# Patient Record
Sex: Female | Born: 1940 | Race: White | Hispanic: No | Marital: Single | Smoking: Never smoker
Health system: Southern US, Community
[De-identification: ages and names within clinical notes are randomized; demographics above are authoritative.]

## PROBLEM LIST (undated history)

## (undated) DIAGNOSIS — E7800 Pure hypercholesterolemia, unspecified: Secondary | ICD-10-CM

## (undated) DIAGNOSIS — E039 Hypothyroidism, unspecified: Secondary | ICD-10-CM

## (undated) DIAGNOSIS — K219 Gastro-esophageal reflux disease without esophagitis: Secondary | ICD-10-CM

## (undated) HISTORY — DX: Gastro-esophageal reflux disease without esophagitis: K21.9

## (undated) HISTORY — PX: TEMPOROMANDIBULAR JOINT SURGERY: SHX35

## (undated) HISTORY — PX: NECK SURGERY: SHX720

## (undated) HISTORY — DX: Hypothyroidism, unspecified: E03.9

## (undated) HISTORY — PX: TONSILLECTOMY: SUR1361

## (undated) HISTORY — PX: ABDOMINAL HYSTERECTOMY: SHX81

## (undated) HISTORY — PX: ELBOW SURGERY: SHX618

---

## 2002-02-24 ENCOUNTER — Inpatient Hospital Stay (HOSPITAL_COMMUNITY): Admission: AD | Admit: 2002-02-24 | Discharge: 2002-02-24 | Payer: Self-pay | Admitting: Obstetrics and Gynecology

## 2012-01-29 ENCOUNTER — Emergency Department (HOSPITAL_COMMUNITY): Payer: Medicare Other

## 2012-01-29 ENCOUNTER — Encounter (HOSPITAL_COMMUNITY): Payer: Self-pay | Admitting: *Deleted

## 2012-01-29 ENCOUNTER — Emergency Department (HOSPITAL_COMMUNITY)
Admission: EM | Admit: 2012-01-29 | Discharge: 2012-01-29 | Disposition: A | Discharge status: Home / Self Care | Payer: Medicare Other

## 2012-01-29 ENCOUNTER — Emergency Department (HOSPITAL_COMMUNITY)
Admission: EM | Admit: 2012-01-29 | Discharge: 2012-01-29 | Disposition: A | Discharge status: Home / Self Care | Payer: Medicare Other | Attending: Emergency Medicine | Admitting: Emergency Medicine

## 2012-01-29 DIAGNOSIS — E7800 Pure hypercholesterolemia, unspecified: Secondary | ICD-10-CM | POA: Insufficient documentation

## 2012-01-29 DIAGNOSIS — J3489 Other specified disorders of nose and nasal sinuses: Secondary | ICD-10-CM | POA: Insufficient documentation

## 2012-01-29 DIAGNOSIS — J40 Bronchitis, not specified as acute or chronic: Secondary | ICD-10-CM | POA: Insufficient documentation

## 2012-01-29 HISTORY — DX: Pure hypercholesterolemia, unspecified: E78.00

## 2012-01-29 MED ORDER — ALBUTEROL SULFATE HFA 108 (90 BASE) MCG/ACT IN AERS
INHALATION_SPRAY | RESPIRATORY_TRACT | Status: AC
  Filled 2012-01-29: qty 6.7

## 2012-01-29 MED ORDER — ALBUTEROL SULFATE HFA 108 (90 BASE) MCG/ACT IN AERS: 2.0000 | INHALATION_SPRAY | Freq: Four times a day (QID) | RESPIRATORY_TRACT | Status: DC

## 2012-01-29 MED ORDER — GUAIFENESIN ER 600 MG PO TB12: 600.0000 mg | ORAL_TABLET | Freq: Two times a day (BID) | ORAL | Status: AC

## 2012-01-29 MED ORDER — SALINE SPRAY 0.65 % NA SOLN
1.0000 | Freq: Once | NASAL | Status: AC
  Administered 2012-01-29: 1 via NASAL
  Filled 2012-01-29 (×2): qty 44

## 2012-01-29 NOTE — ED Provider Notes (Signed)
Medical screening examination/treatment/procedure(s) were performed by non-physician practitioner and as supervising physician I was immediately available for consultation/collaboration.  Nicholes Stairs, MD 01/29/12 [phone]

## 2012-01-29 NOTE — ED Notes (Signed)
Pt reports cough/congestion since 2/13. Given medication to help when previously seen in ED, states has taken medication without improvement. States is also homeless, not eating, feels weak.

## 2012-01-29 NOTE — Discharge Instructions (Signed)
Your chest Xray was negative and did not show signs of a pneumonia. This is likely a viral process which means that antibiotics will not be helpful. You have been given an albuterol inhaler here. Please use this every 4 hours for the next 2 days then every 4 hours as needed. Alternate ibuprofen and tylenol every 4 hours as needed for fever. Return to the ER if you are having a high fever not controlled by medicine, worsening condition, or with any other concerns.  Bronchitis Bronchitis is the body's way of reacting to injury and/or infection (inflammation) of the bronchi. Bronchi are the air tubes that extend from the windpipe into the lungs. If the inflammation becomes severe, it may cause shortness of breath. CAUSES  Inflammation may be caused by:  A virus.   Germs (bacteria).   Dust.   Allergens.   Pollutants and many other irritants.  The cells lining the bronchial tree are covered with tiny hairs (cilia). These constantly beat upward, away from the lungs, toward the mouth. This keeps the lungs free of pollutants. When these cells become too irritated and are unable to do their job, mucus begins to develop. This causes the characteristic cough of bronchitis. The cough clears the lungs when the cilia are unable to do their job. Without either of these protective mechanisms, the mucus would settle in the lungs. Then you would develop pneumonia. Smoking is a common cause of bronchitis and can contribute to pneumonia. Stopping this habit is the single most important thing you can do to help yourself. TREATMENT   Your caregiver may prescribe an antibiotic if the cough is caused by bacteria. Also, medicines that open up your airways make it easier to breathe. Your caregiver may also recommend or prescribe an expectorant. It will loosen the mucus to be coughed up. Only take over-the-counter or prescription medicines for pain, discomfort, or fever as directed by your caregiver.   Removing whatever  causes the problem (smoking, for example) is critical to preventing the problem from getting worse.   Cough suppressants may be prescribed for relief of cough symptoms.   Inhaled medicines may be prescribed to help with symptoms now and to help prevent problems from returning.   For those with recurrent (chronic) bronchitis, there may be a need for steroid medicines.  SEEK IMMEDIATE MEDICAL CARE IF:   During treatment, you develop more pus-like mucus (purulent sputum).   You have a fever.   Your baby is older than 3 months with a rectal temperature of 102 F (38.9 C) or higher.   Your baby is 58 months old or younger with a rectal temperature of 100.4 F (38 C) or higher.   You become progressively more ill.   You have increased difficulty breathing, wheezing, or shortness of breath.  It is necessary to seek immediate medical care if you are elderly or sick from any other disease. MAKE SURE YOU:   Understand these instructions.   Will watch your condition.   Will get help right away if you are not doing well or get worse.  Document Released: 11/23/2005 Document Revised: 08/05/2011 Document Reviewed: 10/02/2008 [facility] Patient Information 2012 [place], [place].Antibiotic Nonuse  Your caregiver felt that the infection or problem was not one that would be helped with an antibiotic. Infections may be caused by viruses or bacteria. Only a caregiver can tell which one of these is the likely cause of an illness. A cold is the most common cause of infection in both adults  and children. A cold is a virus. Antibiotic treatment will have no effect on a viral infection. Viruses can lead to many lost days of work caring for sick children and many missed days of school. Children may catch as many as 10 "colds" or "flus" per year during which they can be tearful, cranky, and uncomfortable. The goal of treating a virus is aimed at keeping the ill person comfortable. Antibiotics are medications used  to help the body fight bacterial infections. There are relatively few types of bacteria that cause infections but there are hundreds of viruses. While both viruses and bacteria cause infection they are very different types of germs. A viral infection will typically go away by itself within 7 to 10 days. Bacterial infections may spread or get worse without antibiotic treatment. Examples of bacterial infections are:  Sore throats (like strep throat or tonsillitis).   Infection in the lung (pneumonia).   Ear and skin infections.  Examples of viral infections are:  Colds or flus.   Most coughs and bronchitis.   Sore throats not caused by Strep.   Runny noses.  It is often best not to take an antibiotic when a viral infection is the cause of the problem. Antibiotics can kill off the helpful bacteria that we have inside our body and allow harmful bacteria to start growing. Antibiotics can cause side effects such as allergies, nausea, and diarrhea without helping to improve the symptoms of the viral infection. Additionally, repeated uses of antibiotics can cause bacteria inside of our body to become resistant. That resistance can be passed onto harmful bacterial. The next time you have an infection it may be harder to treat if antibiotics are used when they are not needed. Not treating with antibiotics allows our own immune system to develop and take care of infections more efficiently. Also, antibiotics will work better for [place] when they are prescribed for bacterial infections. Treatments for a child that is ill may include:  Give extra fluids throughout the day to stay hydrated.   Get plenty of rest.   Only give your child over-the-counter or prescription medicines for pain, discomfort, or fever as directed by your caregiver.   The use of a cool mist humidifier may help stuffy noses.   Cold medications if suggested by your caregiver.  Your caregiver may decide to start you on an antibiotic  if:  The problem you were seen for today continues for a longer length of time than expected.   You develop a secondary bacterial infection.  SEEK MEDICAL CARE IF:  Fever lasts longer than 5 days.   Symptoms continue to get worse after 5 to 7 days or become severe.   Difficulty in breathing develops.   Signs of dehydration develop (poor drinking, rare urinating, dark colored urine).   Changes in behavior or worsening tiredness (listlessness or lethargy).  Document Released: 02/01/2002 Document Revised: 08/05/2011 Document Reviewed: 07/31/2009 [facility] Patient Information 2012 [place], [place].

## 2012-01-29 NOTE — ED Provider Notes (Signed)
History     CSN: [id number]  Arrival date & time 01/29/12  0907   First MD Initiated Contact with Patient 01/29/12 0935      Chief Complaint  Patient presents with  . Cough  . Nasal Congestion    (Consider location/radiation/quality/duration/timing/severity/associated sxs/prior treatment) Patient is a 71 y.o. female presenting with cough. The history is provided by the patient.  Cough   Patient presents with cough and congestion, which started on February 13. She states that she was seen in the ED at [facility] on February 16. A chest x-ray was performed during that visit which apparently showed a pneumonia. She was placed on a Z-Pak. She states that she has not taken all of this, but does not feel any better. She has had some occasional chills, but denies fever. The cough is productive in nature of yellow sputum. She has not noticed any hemoptysis. Denies chest pain. She is not a smoker. Denies abdominal pain, nausea, vomiting, diarrhea. She states that she is homeless, and states that she "feels weak" due to her living situation and not eating regularly.  Past Medical History  Diagnosis Date  . Hypercholesteremia     Past Surgical History  Procedure Date  . Tonsillectomy   . Abdominal hysterectomy   . Elbow surgery   . Temporomandibular joint surgery   . Neck surgery     No family history on file.  History  Substance Use Topics  . Smoking status: Never Smoker   . Smokeless tobacco: Not on file  . Alcohol Use: No    OB History    Grav Para Term Preterm Abortions TAB SAB Ect Mult Living                  Review of Systems  Respiratory: Positive for cough.    review of systems negative except as indicated in the history of present illness.  Allergies  Stadol  Home Medications   Current Outpatient Rx  Name Route Sig Dispense Refill  . ACETAMINOPHEN 500 MG PO TABS Oral Take 1,000 mg by mouth every 6 (six) hours as needed. pain    . ALENDRONATE SODIUM  70 MG PO TABS Oral Take 70 mg by mouth every 7 (seven) days. Take with a full glass of water on an empty stomach.takes on Tuesday or wednesday    . ASPIRIN EC 81 MG PO TBEC Oral Take 81 mg by mouth daily.    . ESTER C PO Oral Take 1,000 mg by mouth daily.    Marland Kitchen CALCIUM CARBONATE-VITAMIN D 500-200 MG-UNIT PO TABS Oral Take 1 tablet by mouth daily.    Marland Kitchen GABAPENTIN 600 MG PO TABS Oral Take 600 mg by mouth 3 (three) times daily.    Marland Kitchen LEVOTHYROXINE SODIUM 50 MCG PO TABS Oral Take 50 mcg by mouth daily.    Marland Kitchen OMEPRAZOLE 40 MG PO CPDR Oral Take 40 mg by mouth 2 (two) times daily.      BP 138/64  Pulse 79  Temp(Src) 98.2 F (36.8 C) (Oral)  Resp 18  SpO2 97%  Physical Exam  Nursing note and vitals reviewed. Constitutional: She is oriented to person, place, and time. She appears well-developed and well-nourished. No distress.       Slender-appearing, appears older than her stated age  HENT:  Head: Normocephalic and atraumatic.  Right Ear: External ear normal.  Left Ear: External ear normal.  Nose: Nose normal.  Mouth/Throat: Oropharynx is clear and moist. No oropharyngeal exudate.  Bilateral tympanic membranes normal. There is no tenderness to palpation or percussion over the sinuses.  Eyes: Conjunctivae and EOM are normal. Pupils are equal, round, and reactive to light. Right eye exhibits no discharge. Left eye exhibits no discharge.  Neck: Normal range of motion. Neck supple.  Cardiovascular: Normal rate, regular rhythm and normal heart sounds.   Pulmonary/Chest: Effort normal.       Rhonchi to right lung. Left lung sounds normal. Good bilateral air movement.  Abdominal: Soft. Bowel sounds are normal. There is no tenderness. There is no rebound.  Musculoskeletal: Normal range of motion.  Lymphadenopathy:    She has no cervical adenopathy.  Neurological: She is alert and oriented to person, place, and time.  Skin: Skin is warm and dry. No rash noted. She is not diaphoretic.    Psychiatric: She has a normal mood and affect.    ED Course  Procedures (including critical care time)  Labs Reviewed - No data to display Dg Chest 2 View  01/29/2012  *RADIOLOGY REPORT*  Clinical Data: Productive cough for 2 weeks  CHEST - 2 VIEW  Comparison: None.  Findings: The lungs are hyperaerated but no focal infiltrate or effusion is seen.  Mediastinal contours appear normal.  There may be mild scarring in the right lung apex laterally.  The heart is within normal limits in size.  Moderate thoracolumbar scoliosis is present with degenerative change.  IMPRESSION:  Hyperaeration.  No active lung disease.  Original Report Authenticated By: Juline Patch, M.D.   I personally reviewed the CXR.   1. Bronchitis       MDM  Patient was approximately 10 days of cough which has been productive and nasal congestion. She has completed a Z-Pak for this. On exam, she has right-sided rhonchi; exam is otherwise unremarkable. I personally reviewed the chest x-ray; there is no evidence of a pneumonia. She is afebrile here and does not appear toxic. This appears to be a viral process, and she does not need further antibiotics at this time. She was given an albuterol inhaler and saline nasal spray. Respiratory showed her how to use the inhaler. Return precautions were discussed.  Per the patient's request, I contacted the social worker for any resources that she was able to offer the patient's living situation. The patient is currently staying at the [facility], and the Child psychotherapist stated that she did not have any further resources to offer at this time.        Grant Fontana, [place] 01/29/12 [phone]

## 2017-01-26 ENCOUNTER — Encounter: Payer: Self-pay | Admitting: Pediatrics

## 2017-01-26 ENCOUNTER — Ambulatory Visit (INDEPENDENT_AMBULATORY_CARE_PROVIDER_SITE_OTHER): Payer: Medicare HMO | Admitting: Pediatrics

## 2017-01-26 VITALS — BP 150/70 | HR 72 | Temp 97.7°F | Resp 20 | Ht 62.99 in | Wt 99.9 lb

## 2017-01-26 DIAGNOSIS — H1045 Other chronic allergic conjunctivitis: Secondary | ICD-10-CM

## 2017-01-26 DIAGNOSIS — J3089 Other allergic rhinitis: Secondary | ICD-10-CM | POA: Diagnosis not present

## 2017-01-26 DIAGNOSIS — M818 Other osteoporosis without current pathological fracture: Secondary | ICD-10-CM

## 2017-01-26 DIAGNOSIS — H1010 Acute atopic conjunctivitis, unspecified eye: Secondary | ICD-10-CM

## 2017-01-26 DIAGNOSIS — K219 Gastro-esophageal reflux disease without esophagitis: Secondary | ICD-10-CM | POA: Diagnosis not present

## 2017-01-26 MED ORDER — FLUTICASONE PROPIONATE 50 MCG/ACT NA SUSP: NASAL | 5 refills | Status: AC

## 2017-01-26 NOTE — Progress Notes (Signed)
[address] [place] [address] Dept: [phone]  New Patient Note  Patient ID: Brenda Simpson, female    DOB: 02-07-1941  Age: 76 y.o. MRN: [id number] Date of Office Visit: 01/26/2017 Referring provider: Ardyth Gal, MD [address] [place], [place] [address]    Chief Complaint: Allergies (history of allergy injections) and Nasal Congestion (alot of sinus drainage, clears her throat.)  HPI Brenda Simpson presents for evaluation of persistent nasal congestion and postnasal drainage. She has had this problems for several years. She used to be on allergy injections. She has aggravation of her symptoms on exposure to dust, cigarette smoke, perfumes, the springtime, cats and dogs. She has gastroesophageal reflux and occasionally has a cough. She has never had asthmatic symptoms. She has never had eczema  Review of Systems  Constitutional: Negative.   HENT:       Perennial nasal congestion since childhood. At times fullness in the right ear. 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 Occasional cough. No wheezing.  Cardiovascular: Negative.   Gastrointestinal:       Gastroesophageal reflux  Genitourinary:       Hysterectomy  Musculoskeletal:       Osteoporosis. Surgery on her left elbow  Skin: Negative.   Neurological:       Surgery done on her neck  Endo/Heme/Allergies:       No diabetes. She has hypothyroidism  Psychiatric/Behavioral: Negative.     Outpatient Encounter Prescriptions as of 01/26/2017  Medication Sig  . acetaminophen (TYLENOL) 500 MG tablet Take 500 mg by mouth.  Marland Kitchen aspirin EC 81 MG tablet Take 81 mg by mouth daily.  Marland Kitchen Bioflavonoid Products (ESTER C PO) Take 1,000 mg by mouth daily.  Marland Kitchen gabapentin (NEURONTIN) 600 MG tablet Take by mouth.  . Garlic 400 MG TABS Take by mouth.  . levothyroxine (SYNTHROID, LEVOTHROID) 50 MCG tablet Take by  mouth.  . niacin (NIASPAN) 500 MG CR tablet Take 500 mg by mouth.  Marland Kitchen omeprazole (PRILOSEC) 40 MG capsule Take by mouth.  . [DISCONTINUED] aspirin EC 81 MG tablet Take 81 mg by mouth.  Marland Kitchen alendronate (FOSAMAX) 70 MG tablet Take 70 mg by mouth every 7 (seven) days. Take with a full glass of water on an empty stomach.takes on Tuesday or wednesday  . calcium-vitamin D (OSCAL WITH D) 500-200 MG-UNIT per tablet Take 1 tablet by mouth daily.  . fluticasone (FLONASE) 50 MCG/ACT nasal spray 2 sprays per nostril at night  . LYCOPENE PO Frequency:Daily   Dosage:1     Instructions:  Note:TAKE 1 TABLET DAILY.  . [DISCONTINUED] acetaminophen (TYLENOL) 500 MG tablet Take 1,000 mg by mouth every 6 (six) hours as needed. pain  . [DISCONTINUED] gabapentin (NEURONTIN) 600 MG tablet Take 600 mg by mouth 3 (three) times daily.  . [DISCONTINUED] levothyroxine (SYNTHROID, LEVOTHROID) 50 MCG tablet Take 50 mcg by mouth daily.  . [DISCONTINUED] omeprazole (PRILOSEC) 40 MG capsule Take 40 mg by mouth 2 (two) times daily.   No facility-administered encounter medications on file as of 01/26/2017.      Drug Allergies:  Allergies  Allergen Reactions  . Butorphanol Tartrate     Other reaction(s): ATAXIA  . Esomeprazole Magnesium Other (See Comments)    Constipation  . Ibuprofen     Other reaction(s): DIZZINESS  . Stadol [Butorphanol Tartrate] Other (  See Comments)    Cant move  . Cetirizine Other (See Comments)    Drowsiness    Family History: Steffany's family history is not on file..Family history is positive for hay fever. Family history is negative for asthma, sinus problems, eczema, hives, food allergies, chronic bronchitis or emphysema.  Social and environmental. There are no pets in the home. She has never smoked cigarettes. She is not exposed to cigarette smoking.  Physical Exam: BP (!) 150/70 (BP Location: Left Arm, Patient Position: Sitting, Cuff Size: Normal)   Pulse 72   Temp 97.7 F (36.5 C) (Oral)    Resp 20   Ht 5' 2.99" (1.6 m)   Wt 99 lb 13.9 oz (45.3 kg)   BMI 17.70 kg/m    Physical Exam  Constitutional: She is oriented to person, place, and time. She appears well-developed and well-nourished.  HENT:  Eyes normal. Ears normal. Nose turbinates with clear nasal discharge. Pharynx normal. The the left maxillary area was abnormal and had a large scar from her TMJ surgery  Neck: Neck supple. No thyromegaly present.  Cardiovascular:  S1 and S2 normal no murmurs  Pulmonary/Chest:  Clear to percussion and auscultation  Abdominal: Soft. There is no tenderness (no hepatosplenomegaly).  Lymphadenopathy:    She has no cervical adenopathy.  Neurological: She is alert and oriented to person, place, and time.  Skin:  Clear  Psychiatric: She has a normal mood and affect. Her behavior is normal. Judgment and thought content normal.  Vitals reviewed.   Diagnostics: Allergy skin tests were positive to dust mites, cat and dog   Assessment  Assessment and Plan: 1. Other allergic rhinitis   2. Seasonal allergic conjunctivitis   3. Gastroesophageal reflux disease without esophagitis   4. Other osteoporosis without current pathological fracture     Meds ordered this encounter  Medications  . fluticasone (FLONASE) 50 MCG/ACT nasal spray    Sig: 2 sprays per nostril at night    Dispense:  16 g    Refill:  5    For stuffy nose.    Patient Instructions  Environmental control of dust mite Claritin  10 mg once a day for runny nose or itchy eyes Fluticasone 2 sprays per nostril at night for stuffy nose Opcon-A one drop 3 times a day if needed for itchy eyes Call me if you're not doing better on this treatment plan Continue on your other medications  We may need to use allergy injections if her symptoms do not improve   Return in about 4 weeks (around 02/23/2017).   Thank you for the opportunity to care for this patient.  Please do not hesitate to contact me with questions.  Tonette BihariJ.  A. Bardelas, M.D.  [facility]  [address] [place], [place] [address] [phone]

## 2017-01-26 NOTE — Patient Instructions (Addendum)
Environmental control of dust mite Claritin  10 mg once a day for runny nose or itchy eyes Fluticasone 2 sprays per nostril at night for stuffy nose Opcon-A one drop 3 times a day if needed for itchy eyes Call me if you're not doing better on this treatment plan Continue on your other medications  We may need to use allergy injections if her symptoms do not improve

## 2017-01-27 DIAGNOSIS — M818 Other osteoporosis without current pathological fracture: Secondary | ICD-10-CM | POA: Insufficient documentation

## 2017-01-27 DIAGNOSIS — K219 Gastro-esophageal reflux disease without esophagitis: Secondary | ICD-10-CM | POA: Insufficient documentation

## 2017-01-27 DIAGNOSIS — J3089 Other allergic rhinitis: Secondary | ICD-10-CM | POA: Insufficient documentation

## 2017-01-27 DIAGNOSIS — H1010 Acute atopic conjunctivitis, unspecified eye: Secondary | ICD-10-CM | POA: Insufficient documentation

## 2017-02-23 ENCOUNTER — Ambulatory Visit: Payer: Medicare Other | Admitting: Pediatrics

## 2020-06-15 ENCOUNTER — Other Ambulatory Visit: Payer: Self-pay

## 2020-06-15 ENCOUNTER — Emergency Department (HOSPITAL_COMMUNITY): Payer: Medicare Other

## 2020-06-15 ENCOUNTER — Encounter (HOSPITAL_COMMUNITY): Payer: Self-pay

## 2020-06-15 ENCOUNTER — Inpatient Hospital Stay (HOSPITAL_COMMUNITY)
Admission: EM | Admit: 2020-06-15 | Discharge: 2020-06-23 | DRG: 644 | Disposition: A | Discharge status: Skilled Nursing Facility | Payer: Medicare Other | Attending: Internal Medicine | Admitting: Internal Medicine

## 2020-06-15 DIAGNOSIS — Z20822 Contact with and (suspected) exposure to covid-19: Secondary | ICD-10-CM | POA: Diagnosis present

## 2020-06-15 DIAGNOSIS — R1311 Dysphagia, oral phase: Secondary | ICD-10-CM | POA: Diagnosis present

## 2020-06-15 DIAGNOSIS — H409 Unspecified glaucoma: Secondary | ICD-10-CM | POA: Diagnosis present

## 2020-06-15 DIAGNOSIS — G629 Polyneuropathy, unspecified: Secondary | ICD-10-CM | POA: Diagnosis present

## 2020-06-15 DIAGNOSIS — E871 Hypo-osmolality and hyponatremia: Secondary | ICD-10-CM | POA: Diagnosis present

## 2020-06-15 DIAGNOSIS — R1013 Epigastric pain: Secondary | ICD-10-CM | POA: Diagnosis present

## 2020-06-15 DIAGNOSIS — E222 Syndrome of inappropriate secretion of antidiuretic hormone: Secondary | ICD-10-CM | POA: Diagnosis not present

## 2020-06-15 DIAGNOSIS — H547 Unspecified visual loss: Secondary | ICD-10-CM | POA: Diagnosis present

## 2020-06-15 DIAGNOSIS — J949 Pleural condition, unspecified: Secondary | ICD-10-CM | POA: Diagnosis present

## 2020-06-15 DIAGNOSIS — R636 Underweight: Secondary | ICD-10-CM | POA: Diagnosis present

## 2020-06-15 DIAGNOSIS — Z79899 Other long term (current) drug therapy: Secondary | ICD-10-CM

## 2020-06-15 DIAGNOSIS — Z881 Allergy status to other antibiotic agents status: Secondary | ICD-10-CM

## 2020-06-15 DIAGNOSIS — M818 Other osteoporosis without current pathological fracture: Secondary | ICD-10-CM | POA: Diagnosis present

## 2020-06-15 DIAGNOSIS — Z681 Body mass index (BMI) 19 or less, adult: Secondary | ICD-10-CM

## 2020-06-15 DIAGNOSIS — R064 Hyperventilation: Secondary | ICD-10-CM | POA: Diagnosis not present

## 2020-06-15 DIAGNOSIS — K219 Gastro-esophageal reflux disease without esophagitis: Secondary | ICD-10-CM | POA: Diagnosis present

## 2020-06-15 DIAGNOSIS — Z7982 Long term (current) use of aspirin: Secondary | ICD-10-CM

## 2020-06-15 DIAGNOSIS — Z7983 Long term (current) use of bisphosphonates: Secondary | ICD-10-CM

## 2020-06-15 DIAGNOSIS — Z888 Allergy status to other drugs, medicaments and biological substances status: Secondary | ICD-10-CM

## 2020-06-15 DIAGNOSIS — R0600 Dyspnea, unspecified: Secondary | ICD-10-CM

## 2020-06-15 DIAGNOSIS — Z90710 Acquired absence of both cervix and uterus: Secondary | ICD-10-CM

## 2020-06-15 DIAGNOSIS — K5900 Constipation, unspecified: Secondary | ICD-10-CM | POA: Diagnosis not present

## 2020-06-15 DIAGNOSIS — R112 Nausea with vomiting, unspecified: Secondary | ICD-10-CM

## 2020-06-15 DIAGNOSIS — E785 Hyperlipidemia, unspecified: Secondary | ICD-10-CM | POA: Diagnosis present

## 2020-06-15 DIAGNOSIS — G253 Myoclonus: Secondary | ICD-10-CM | POA: Diagnosis present

## 2020-06-15 DIAGNOSIS — E039 Hypothyroidism, unspecified: Secondary | ICD-10-CM | POA: Diagnosis present

## 2020-06-15 DIAGNOSIS — J948 Other specified pleural conditions: Secondary | ICD-10-CM | POA: Diagnosis present

## 2020-06-15 DIAGNOSIS — Z886 Allergy status to analgesic agent status: Secondary | ICD-10-CM

## 2020-06-15 DIAGNOSIS — Z79890 Hormone replacement therapy (postmenopausal): Secondary | ICD-10-CM

## 2020-06-15 DIAGNOSIS — F419 Anxiety disorder, unspecified: Secondary | ICD-10-CM | POA: Diagnosis present

## 2020-06-15 DIAGNOSIS — J3089 Other allergic rhinitis: Secondary | ICD-10-CM | POA: Diagnosis present

## 2020-06-15 LAB — CBC WITH DIFFERENTIAL/PLATELET
Abs Immature Granulocytes: 0.03 10*3/uL (ref 0.00–0.07)
Basophils Absolute: 0 10*3/uL (ref 0.0–0.1)
Basophils Relative: 0 %
Eosinophils Absolute: 0 10*3/uL (ref 0.0–0.5)
Eosinophils Relative: 0 %
HCT: 37.5 % (ref 36.0–46.0)
Hemoglobin: 13.7 g/dL (ref 12.0–15.0)
Immature Granulocytes: 0 %
Lymphocytes Relative: 7 %
Lymphs Abs: 0.7 10*3/uL (ref 0.7–4.0)
MCH: 32.4 pg (ref 26.0–34.0)
MCHC: 36.5 g/dL — ABNORMAL HIGH (ref 30.0–36.0)
MCV: 88.7 fL (ref 80.0–100.0)
Monocytes Absolute: 0.7 10*3/uL (ref 0.1–1.0)
Monocytes Relative: 7 %
Neutro Abs: 8.2 10*3/uL — ABNORMAL HIGH (ref 1.7–7.7)
Neutrophils Relative %: 86 %
Platelets: 144 10*3/uL — ABNORMAL LOW (ref 150–400)
RBC: 4.23 MIL/uL (ref 3.87–5.11)
RDW: 11.6 % (ref 11.5–15.5)
WBC: 9.6 10*3/uL (ref 4.0–10.5)
nRBC: 0 % (ref 0.0–0.2)

## 2020-06-15 LAB — COMPREHENSIVE METABOLIC PANEL
ALT: 23 U/L (ref 0–44)
AST: 27 U/L (ref 15–41)
Albumin: 3.8 g/dL (ref 3.5–5.0)
Alkaline Phosphatase: 44 U/L (ref 38–126)
Anion gap: 11 (ref 5–15)
BUN: 15 mg/dL (ref 8–23)
CO2: 23 mmol/L (ref 22–32)
Calcium: 8.6 mg/dL — ABNORMAL LOW (ref 8.9–10.3)
Chloride: 85 mmol/L — ABNORMAL LOW (ref 98–111)
Creatinine, Ser: 0.52 mg/dL (ref 0.44–1.00)
GFR calc Af Amer: >60 mL/min (ref >60)
GFR calc non Af Amer: >60 mL/min (ref >60)
Glucose, Bld: 162 mg/dL — ABNORMAL HIGH (ref 70–99)
Potassium: 4.2 mmol/L (ref 3.5–5.1)
Sodium: 119 mmol/L — CL (ref 135–145)
Total Bilirubin: 1 mg/dL (ref 0.3–1.2)
Total Protein: 6.8 g/dL (ref 6.5–8.1)

## 2020-06-15 LAB — RAPID URINE DRUG SCREEN, HOSP PERFORMED
Amphetamines: NOT DETECTED
Barbiturates: NOT DETECTED
Benzodiazepines: NOT DETECTED
Cocaine: NOT DETECTED
Opiates: NOT DETECTED
Tetrahydrocannabinol: NOT DETECTED

## 2020-06-15 LAB — URINALYSIS, ROUTINE W REFLEX MICROSCOPIC
Bilirubin Urine: NEGATIVE
Glucose, UA: 150 mg/dL — AB
Hgb urine dipstick: NEGATIVE
Ketones, ur: NEGATIVE mg/dL
Leukocytes,Ua: NEGATIVE
Nitrite: NEGATIVE
Protein, ur: NEGATIVE mg/dL
Specific Gravity, Urine: 1.01 (ref 1.005–1.030)
pH: 7 (ref 5.0–8.0)

## 2020-06-15 LAB — D-DIMER, QUANTITATIVE: D-Dimer, Quant: 2.69 ug/mL-FEU — ABNORMAL HIGH (ref 0.00–0.50)

## 2020-06-15 LAB — SODIUM, URINE, RANDOM: Sodium, Ur: 95 mmol/L

## 2020-06-15 LAB — ETHANOL: Alcohol, Ethyl (B): <10 mg/dL (ref <10)

## 2020-06-15 LAB — SARS CORONAVIRUS 2 BY RT PCR (HOSPITAL ORDER, PERFORMED IN ~~LOC~~ HOSPITAL LAB): SARS Coronavirus 2: NEGATIVE

## 2020-06-15 MED ORDER — IOHEXOL 350 MG/ML SOLN
100.0000 mL | Freq: Once | INTRAVENOUS | Status: AC | PRN
  Administered 2020-06-15: 100 mL via INTRAVENOUS

## 2020-06-15 MED ORDER — SODIUM CHLORIDE (PF) 0.9 % IJ SOLN
INTRAMUSCULAR | Status: AC
  Filled 2020-06-15: qty 50

## 2020-06-15 MED ORDER — LORAZEPAM 2 MG/ML IJ SOLN
0.5000 mg | Freq: Once | INTRAMUSCULAR | Status: AC
  Administered 2020-06-15: 0.5 mg via INTRAVENOUS
  Filled 2020-06-15: qty 1

## 2020-06-15 MED ORDER — SODIUM CHLORIDE 1 G PO TABS: 2.0000 g | ORAL_TABLET | Freq: Four times a day (QID) | ORAL | Status: DC

## 2020-06-15 MED ORDER — SODIUM CHLORIDE 0.9 % IV SOLN: INTRAVENOUS | Status: DC

## 2020-06-15 MED ORDER — SODIUM CHLORIDE 1 G PO TABS
1.0000 g | ORAL_TABLET | Freq: Four times a day (QID) | ORAL | Status: DC
  Administered 2020-06-16 (×2): 1 g via ORAL
  Filled 2020-06-15 (×2): qty 1

## 2020-06-15 NOTE — ED Triage Notes (Signed)
Pt BIBA from home-   Per EMS-  Pt c/o SHOB, weakness x 1 week.  Normal PO intake. AOx4.  Ambulatory with assistance.  Denies n/v.  C/o dizziness with movement.   96% on RA; EMS placed on 2 L for comfort.

## 2020-06-15 NOTE — ED Provider Notes (Signed)
[facility]-EMERGENCY DEPT Provider Note   CSN: [id number] Arrival date & time: 06/15/20  1755     History Chief Complaint  Patient presents with  . Shortness of Breath  . Weakness    Brenda Simpson is a 79 y.o. female.  79 year old female presents with nonexertional shortness of breath x2 days.  No reported fever or cough.  She also notes whole body weakness.  Had a long discussion with the patient's son who states that she has had increased anxiety.  The patient is a very poor historian but denies any urinary symptoms.  No chest pain or chest pressure.  No SI or HI.  No treatment use prior to arrival.        Past Medical History:  Diagnosis Date  . GERD (gastroesophageal reflux disease)   . Hypercholesteremia   . Hypothyroidism     Patient Active Problem List   Diagnosis Date Noted  . Other allergic rhinitis 01/27/2017  . Seasonal allergic conjunctivitis 01/27/2017  . Gastroesophageal reflux disease without esophagitis 01/27/2017  . Other osteoporosis without current pathological fracture 01/27/2017    Past Surgical History:  Procedure Laterality Date  . ABDOMINAL HYSTERECTOMY    . ELBOW SURGERY    . NECK SURGERY    . TEMPOROMANDIBULAR JOINT SURGERY    . TONSILLECTOMY       OB History   No obstetric history on file.     Family History  Problem Relation Age of Onset  . Allergic rhinitis Neg Hx   . Angioedema Neg Hx   . Asthma Neg Hx   . Eczema Neg Hx   . Immunodeficiency Neg Hx   . Urticaria Neg Hx     Social History   Tobacco Use  . Smoking status: Never Smoker  . Smokeless tobacco: Never Used  Substance Use Topics  . Alcohol use: No  . Drug use: No    Home Medications Prior to Admission medications   Medication Sig Start Date End Date Taking? Authorizing Provider  acetaminophen (TYLENOL) 500 MG tablet Take 500 mg by mouth. 03/23/12   [provider]  alendronate (FOSAMAX) 70 MG tablet Take 70 mg by mouth  every 7 (seven) days. Take with a full glass of water on an empty stomach.takes on Tuesday or wednesday    [provider]  aspirin EC 81 MG tablet Take 81 mg by mouth daily.    [provider]  Bioflavonoid Products (ESTER C PO) Take 1,000 mg by mouth daily.    [provider]  calcium-vitamin D (OSCAL WITH D) 500-200 MG-UNIT per tablet Take 1 tablet by mouth daily.    [provider]  fluticasone Aleda Grana) 50 MCG/ACT nasal spray 2 sprays per nostril at night 01/26/17   Bardelas, Jose A, MD  gabapentin (NEURONTIN) 600 MG tablet Take by mouth.    [provider]  Garlic 400 MG TABS Take by mouth. 12/13/13   [provider]  levothyroxine (SYNTHROID, LEVOTHROID) 50 MCG tablet Take by mouth.    [provider]  LYCOPENE PO Frequency:Daily   Dosage:1     Instructions:  Note:TAKE 1 TABLET DAILY. 12/15/13   [provider]  niacin (NIASPAN) 500 MG CR tablet Take 500 mg by mouth. 04/18/14   [provider]  omeprazole (PRILOSEC) 40 MG capsule Take by mouth.    [provider]    Allergies    Butorphanol tartrate, Esomeprazole magnesium, Ibuprofen, Stadol [butorphanol tartrate], and Cetirizine  Review of Systems  Review of Systems  Unable to perform ROS: Acuity of condition    Physical Exam Updated Vital Signs BP (!) 159/89 (BP Location: Right Arm)   Pulse 78   Temp 98.7 F (37.1 C) (Oral)   Resp (!) 28   SpO2 99%   Physical Exam Vitals and nursing note reviewed.  Constitutional:      General: She is not in acute distress.    Appearance: Normal appearance. She is well-developed. She is not toxic-appearing.  HENT:     Head: Normocephalic and atraumatic.  Eyes:     General: Lids are normal.     Conjunctiva/sclera: Conjunctivae normal.     Pupils: Pupils are equal, round, and reactive to light.  Neck:     Thyroid: No thyroid mass.     Trachea: No tracheal deviation.  Cardiovascular:     Rate and  Rhythm: Normal rate and regular rhythm.     Heart sounds: Normal heart sounds. No murmur heard.  No gallop.   Pulmonary:     Effort: Pulmonary effort is normal. No respiratory distress.     Breath sounds: Normal breath sounds. No stridor. No decreased breath sounds, wheezing, rhonchi or rales.  Abdominal:     General: Bowel sounds are normal. There is no distension.     Palpations: Abdomen is soft.     Tenderness: There is no abdominal tenderness. There is no rebound.  Musculoskeletal:        General: No tenderness. Normal range of motion.     Cervical back: Normal range of motion and neck supple.  Skin:    General: Skin is warm and dry.     Findings: No abrasion or rash.  Neurological:     General: No focal deficit present.     Mental Status: She is alert and oriented to person, place, and time.     GCS: GCS eye subscore is 4. GCS verbal subscore is 5. GCS motor subscore is 6.     Cranial Nerves: No cranial nerve deficit.     Sensory: No sensory deficit.     Motor: Tremor present.     Comments: Strength is 5 of 5 in all extremities.  Occasional right shoulder twitching noted but does resolve when she is distracted  Psychiatric:        Attention and Perception: Attention normal.        Mood and Affect: Affect is flat.        Speech: Speech is delayed.        Behavior: Behavior is withdrawn.     ED Results / Procedures / Treatments   Labs (all labs ordered are listed, but only abnormal results are displayed) Labs Reviewed  ETHANOL  RAPID URINE DRUG SCREEN, HOSP PERFORMED  CBC WITH DIFFERENTIAL/PLATELET  COMPREHENSIVE METABOLIC PANEL  URINALYSIS, ROUTINE W REFLEX MICROSCOPIC    EKG None  Radiology No results found.  Procedures Procedures (including critical care time)  Medications Ordered in ED Medications  0.9 %  sodium chloride infusion (has no administration in time range)    ED Course  I have reviewed the triage vital signs and the nursing  notes.  Pertinent labs & imaging results that were available during my care of the patient were reviewed by me and considered in my medical decision making (see chart for details).    MDM Rules/Calculators/A&P  Patient with evidence of hyponatremia here.  Given IV saline will be admitted to the hospitalist Final Clinical Impression(s) / ED Diagnoses Final diagnoses:  None    Rx / DC Orders ED Discharge Orders    None       Lorre Nick, MD 06/18/20 1647

## 2020-06-15 NOTE — ED Notes (Signed)
Date and time results received: 06/15/20 8:26 PM  (use smartphrase ".now" to insert current time)  Test: Sodium Critical Value: 119  Name of Provider Notified: Dr.Allen  Orders Received? Or Actions Taken?:

## 2020-06-16 ENCOUNTER — Encounter (HOSPITAL_COMMUNITY): Payer: Self-pay | Admitting: Internal Medicine

## 2020-06-16 DIAGNOSIS — R064 Hyperventilation: Secondary | ICD-10-CM | POA: Diagnosis not present

## 2020-06-16 DIAGNOSIS — E222 Syndrome of inappropriate secretion of antidiuretic hormone: Secondary | ICD-10-CM | POA: Diagnosis present

## 2020-06-16 DIAGNOSIS — Z20822 Contact with and (suspected) exposure to covid-19: Secondary | ICD-10-CM | POA: Diagnosis present

## 2020-06-16 DIAGNOSIS — F419 Anxiety disorder, unspecified: Secondary | ICD-10-CM | POA: Diagnosis present

## 2020-06-16 DIAGNOSIS — G629 Polyneuropathy, unspecified: Secondary | ICD-10-CM | POA: Diagnosis present

## 2020-06-16 DIAGNOSIS — R259 Unspecified abnormal involuntary movements: Secondary | ICD-10-CM | POA: Diagnosis not present

## 2020-06-16 DIAGNOSIS — J3089 Other allergic rhinitis: Secondary | ICD-10-CM | POA: Diagnosis present

## 2020-06-16 DIAGNOSIS — Z7983 Long term (current) use of bisphosphonates: Secondary | ICD-10-CM | POA: Diagnosis not present

## 2020-06-16 DIAGNOSIS — E039 Hypothyroidism, unspecified: Secondary | ICD-10-CM | POA: Diagnosis present

## 2020-06-16 DIAGNOSIS — R636 Underweight: Secondary | ICD-10-CM | POA: Diagnosis present

## 2020-06-16 DIAGNOSIS — E871 Hypo-osmolality and hyponatremia: Secondary | ICD-10-CM

## 2020-06-16 DIAGNOSIS — E785 Hyperlipidemia, unspecified: Secondary | ICD-10-CM | POA: Diagnosis present

## 2020-06-16 DIAGNOSIS — Z79899 Other long term (current) drug therapy: Secondary | ICD-10-CM | POA: Diagnosis not present

## 2020-06-16 DIAGNOSIS — R1311 Dysphagia, oral phase: Secondary | ICD-10-CM | POA: Diagnosis present

## 2020-06-16 DIAGNOSIS — J949 Pleural condition, unspecified: Secondary | ICD-10-CM | POA: Diagnosis present

## 2020-06-16 DIAGNOSIS — G253 Myoclonus: Secondary | ICD-10-CM | POA: Diagnosis present

## 2020-06-16 DIAGNOSIS — K5900 Constipation, unspecified: Secondary | ICD-10-CM | POA: Diagnosis not present

## 2020-06-16 DIAGNOSIS — Z90710 Acquired absence of both cervix and uterus: Secondary | ICD-10-CM | POA: Diagnosis not present

## 2020-06-16 DIAGNOSIS — Z79890 Hormone replacement therapy (postmenopausal): Secondary | ICD-10-CM | POA: Diagnosis not present

## 2020-06-16 DIAGNOSIS — R0600 Dyspnea, unspecified: Secondary | ICD-10-CM | POA: Diagnosis not present

## 2020-06-16 DIAGNOSIS — H547 Unspecified visual loss: Secondary | ICD-10-CM | POA: Diagnosis present

## 2020-06-16 DIAGNOSIS — R1013 Epigastric pain: Secondary | ICD-10-CM | POA: Diagnosis present

## 2020-06-16 DIAGNOSIS — J948 Other specified pleural conditions: Secondary | ICD-10-CM | POA: Diagnosis not present

## 2020-06-16 DIAGNOSIS — Z7982 Long term (current) use of aspirin: Secondary | ICD-10-CM | POA: Diagnosis not present

## 2020-06-16 DIAGNOSIS — K219 Gastro-esophageal reflux disease without esophagitis: Secondary | ICD-10-CM | POA: Diagnosis present

## 2020-06-16 DIAGNOSIS — H409 Unspecified glaucoma: Secondary | ICD-10-CM | POA: Diagnosis present

## 2020-06-16 DIAGNOSIS — Z681 Body mass index (BMI) 19 or less, adult: Secondary | ICD-10-CM | POA: Diagnosis not present

## 2020-06-16 DIAGNOSIS — M818 Other osteoporosis without current pathological fracture: Secondary | ICD-10-CM | POA: Diagnosis present

## 2020-06-16 DIAGNOSIS — R112 Nausea with vomiting, unspecified: Secondary | ICD-10-CM | POA: Diagnosis not present

## 2020-06-16 LAB — BASIC METABOLIC PANEL
Anion gap: 11 (ref 5–15)
Anion gap: 9 (ref 5–15)
Anion gap: 10 (ref 5–15)
Anion gap: 6 (ref 5–15)
Anion gap: 11 (ref 5–15)
BUN: 10 mg/dL (ref 8–23)
BUN: 13 mg/dL (ref 8–23)
BUN: 15 mg/dL (ref 8–23)
BUN: 17 mg/dL (ref 8–23)
BUN: 23 mg/dL (ref 8–23)
CO2: 20 mmol/L — ABNORMAL LOW (ref 22–32)
CO2: 25 mmol/L (ref 22–32)
CO2: 24 mmol/L (ref 22–32)
CO2: 29 mmol/L (ref 22–32)
CO2: 23 mmol/L (ref 22–32)
Calcium: 8.4 mg/dL — ABNORMAL LOW (ref 8.9–10.3)
Calcium: 8.6 mg/dL — ABNORMAL LOW (ref 8.9–10.3)
Calcium: 8.5 mg/dL — ABNORMAL LOW (ref 8.9–10.3)
Calcium: 8.5 mg/dL — ABNORMAL LOW (ref 8.9–10.3)
Calcium: 8.8 mg/dL — ABNORMAL LOW (ref 8.9–10.3)
Chloride: 95 mmol/L — ABNORMAL LOW (ref 98–111)
Chloride: 93 mmol/L — ABNORMAL LOW (ref 98–111)
Chloride: 93 mmol/L — ABNORMAL LOW (ref 98–111)
Chloride: 92 mmol/L — ABNORMAL LOW (ref 98–111)
Chloride: 95 mmol/L — ABNORMAL LOW (ref 98–111)
Creatinine, Ser: 0.69 mg/dL (ref 0.44–1.00)
Creatinine, Ser: 0.57 mg/dL (ref 0.44–1.00)
Creatinine, Ser: 0.63 mg/dL (ref 0.44–1.00)
Creatinine, Ser: 0.73 mg/dL (ref 0.44–1.00)
Creatinine, Ser: 0.86 mg/dL (ref 0.44–1.00)
GFR calc Af Amer: >60 mL/min (ref >60)
GFR calc Af Amer: >60 mL/min (ref >60)
GFR calc Af Amer: >60 mL/min (ref >60)
GFR calc Af Amer: >60 mL/min (ref >60)
GFR calc Af Amer: >60 mL/min (ref >60)
GFR calc non Af Amer: >60 mL/min (ref >60)
GFR calc non Af Amer: >60 mL/min (ref >60)
GFR calc non Af Amer: >60 mL/min (ref >60)
GFR calc non Af Amer: >60 mL/min (ref >60)
GFR calc non Af Amer: >60 mL/min (ref >60)
Glucose, Bld: 124 mg/dL — ABNORMAL HIGH (ref 70–99)
Glucose, Bld: 117 mg/dL — ABNORMAL HIGH (ref 70–99)
Glucose, Bld: 126 mg/dL — ABNORMAL HIGH (ref 70–99)
Glucose, Bld: 109 mg/dL — ABNORMAL HIGH (ref 70–99)
Glucose, Bld: 123 mg/dL — ABNORMAL HIGH (ref 70–99)
Potassium: 4.2 mmol/L (ref 3.5–5.1)
Potassium: 4.1 mmol/L (ref 3.5–5.1)
Potassium: 4.3 mmol/L (ref 3.5–5.1)
Potassium: 4.8 mmol/L (ref 3.5–5.1)
Potassium: 4.4 mmol/L (ref 3.5–5.1)
Sodium: 126 mmol/L — ABNORMAL LOW (ref 135–145)
Sodium: 127 mmol/L — ABNORMAL LOW (ref 135–145)
Sodium: 127 mmol/L — ABNORMAL LOW (ref 135–145)
Sodium: 127 mmol/L — ABNORMAL LOW (ref 135–145)
Sodium: 129 mmol/L — ABNORMAL LOW (ref 135–145)

## 2020-06-16 LAB — CORTISOL-AM, BLOOD: Cortisol - AM: 14.9 ug/dL (ref 6.7–22.6)

## 2020-06-16 LAB — OSMOLALITY: Osmolality: 269 mOsm/kg — ABNORMAL LOW (ref 275–295)

## 2020-06-16 LAB — OSMOLALITY, URINE: Osmolality, Ur: 371 mOsm/kg (ref 300–900)

## 2020-06-16 LAB — TSH: TSH: 2.385 u[IU]/mL (ref 0.350–4.500)

## 2020-06-16 MED ORDER — ENSURE ENLIVE PO LIQD
237.0000 mL | Freq: Two times a day (BID) | ORAL | Status: DC
  Administered 2020-06-17 – 2020-06-23 (×8): 237 mL via ORAL

## 2020-06-16 MED ORDER — ACETAMINOPHEN 650 MG RE SUPP: 650.0000 mg | Freq: Four times a day (QID) | RECTAL | Status: DC | PRN

## 2020-06-16 MED ORDER — GABAPENTIN 300 MG PO CAPS
600.0000 mg | ORAL_CAPSULE | Freq: Two times a day (BID) | ORAL | Status: DC
  Administered 2020-06-16: 600 mg via ORAL
  Filled 2020-06-16 (×2): qty 2

## 2020-06-16 MED ORDER — HYOSCYAMINE SULFATE 0.125 MG/5ML PO ELIX
0.2500 mg | ORAL_SOLUTION | Freq: Four times a day (QID) | ORAL | Status: DC | PRN
  Filled 2020-06-16: qty 10

## 2020-06-16 MED ORDER — ONDANSETRON HCL 4 MG/2ML IJ SOLN
4.0000 mg | Freq: Four times a day (QID) | INTRAMUSCULAR | Status: DC | PRN
  Administered 2020-06-20 – 2020-06-21 (×3): 4 mg via INTRAVENOUS
  Filled 2020-06-16 (×3): qty 2

## 2020-06-16 MED ORDER — HYOSCYAMINE SULFATE 0.125 MG/ML PO SOLN
0.2500 mg | Freq: Four times a day (QID) | ORAL | Status: DC | PRN
  Filled 2020-06-16: qty 2

## 2020-06-16 MED ORDER — ONDANSETRON HCL 4 MG PO TABS: 4.0000 mg | ORAL_TABLET | Freq: Four times a day (QID) | ORAL | Status: DC | PRN

## 2020-06-16 MED ORDER — SODIUM CHLORIDE 1 G PO TABS: 1.0000 g | ORAL_TABLET | Freq: Two times a day (BID) | ORAL | Status: DC

## 2020-06-16 MED ORDER — ENOXAPARIN SODIUM 40 MG/0.4ML ~~LOC~~ SOLN
40.0000 mg | SUBCUTANEOUS | Status: DC
  Administered 2020-06-17 – 2020-06-23 (×7): 40 mg via SUBCUTANEOUS
  Filled 2020-06-16 (×7): qty 0.4

## 2020-06-16 MED ORDER — SODIUM CHLORIDE 1 G PO TABS
1.0000 g | ORAL_TABLET | Freq: Three times a day (TID) | ORAL | Status: DC
  Administered 2020-06-16 – 2020-06-17 (×2): 1 g via ORAL
  Filled 2020-06-16 (×4): qty 1

## 2020-06-16 MED ORDER — ACETAMINOPHEN 325 MG PO TABS
650.0000 mg | ORAL_TABLET | Freq: Four times a day (QID) | ORAL | Status: DC | PRN
  Administered 2020-06-16: 650 mg via ORAL
  Filled 2020-06-16: qty 2

## 2020-06-16 NOTE — ED Notes (Signed)
Patient is resting comfortably. 
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
## 2020-06-16 NOTE — ED Notes (Signed)
Meal tray given 
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
## 2020-06-16 NOTE — Progress Notes (Signed)
Patient got confused about her gabapentin medication. RN gave patient 600 mg of gabapentin as scheduled and explained to patient that she is getting the exact medication that she is getting at home but pharmacy do not have it in one table it comes in two tablets 300 mg each. Patient called her son later and told him RN gave her excess medications. Son called and RN confirmed the dosage with son patient is taking at home and is 600 mg. Charge RN talked to son and explained to patient that she got the same dose as the one she is taking at home. Patient complained of not feeling fine due to the extra medication she took. Vitals signs was taken and were normal. Patient is presenting calm and we will continue to monitor the  situation closely.

## 2020-06-16 NOTE — Progress Notes (Signed)
  Request seen for biopsy of pleural mass.  Dr. Deanne Coffer reviewed images.  Recommend outpatient PET first. This area could be chronic benign pleural plaque from asbestos exposure.  Graciano Batson S Earlee Herald PA-C 06/16/2020 8:26 AM

## 2020-06-16 NOTE — Progress Notes (Addendum)
Son at bedside. Patient has a sore in her Right ear,which is scabbed over.
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
## 2020-06-16 NOTE — ED Notes (Signed)
Lunch tray given. 

## 2020-06-17 ENCOUNTER — Inpatient Hospital Stay (HOSPITAL_COMMUNITY): Payer: Medicare Other

## 2020-06-17 ENCOUNTER — Encounter (HOSPITAL_COMMUNITY): Payer: Self-pay | Admitting: Internal Medicine

## 2020-06-17 DIAGNOSIS — R259 Unspecified abnormal involuntary movements: Secondary | ICD-10-CM

## 2020-06-17 DIAGNOSIS — F419 Anxiety disorder, unspecified: Secondary | ICD-10-CM

## 2020-06-17 DIAGNOSIS — R1312 Dysphagia, oropharyngeal phase: Secondary | ICD-10-CM

## 2020-06-17 LAB — BASIC METABOLIC PANEL
Anion gap: 12 (ref 5–15)
BUN: 17 mg/dL (ref 8–23)
CO2: 23 mmol/L (ref 22–32)
Calcium: 9.2 mg/dL (ref 8.9–10.3)
Chloride: 97 mmol/L — ABNORMAL LOW (ref 98–111)
Creatinine, Ser: 0.65 mg/dL (ref 0.44–1.00)
GFR calc Af Amer: >60 mL/min (ref >60)
GFR calc non Af Amer: >60 mL/min (ref >60)
Glucose, Bld: 111 mg/dL — ABNORMAL HIGH (ref 70–99)
Potassium: 4.4 mmol/L (ref 3.5–5.1)
Sodium: 132 mmol/L — ABNORMAL LOW (ref 135–145)

## 2020-06-17 LAB — URIC ACID: Uric Acid, Serum: 3.2 mg/dL (ref 2.5–7.1)

## 2020-06-17 MED ORDER — CLONAZEPAM 0.125 MG PO TBDP
0.2500 mg | ORAL_TABLET | Freq: Two times a day (BID) | ORAL | Status: DC | PRN
  Administered 2020-06-17 – 2020-06-22 (×9): 0.25 mg via ORAL
  Filled 2020-06-17 (×9): qty 2

## 2020-06-17 MED ORDER — GABAPENTIN 300 MG PO CAPS
300.0000 mg | ORAL_CAPSULE | Freq: Two times a day (BID) | ORAL | Status: DC
  Administered 2020-06-17 – 2020-06-23 (×13): 300 mg via ORAL
  Filled 2020-06-17 (×13): qty 1

## 2020-06-17 MED ORDER — ASPIRIN EC 81 MG PO TBEC
81.0000 mg | DELAYED_RELEASE_TABLET | Freq: Every day | ORAL | Status: DC
  Administered 2020-06-17 – 2020-06-23 (×7): 81 mg via ORAL
  Filled 2020-06-17 (×7): qty 1

## 2020-06-17 MED ORDER — BISACODYL 10 MG RE SUPP
10.0000 mg | Freq: Every day | RECTAL | Status: DC | PRN
  Administered 2020-06-17: 10 mg via RECTAL
  Filled 2020-06-17: qty 1

## 2020-06-17 MED ORDER — LEVOTHYROXINE SODIUM 50 MCG PO TABS
50.0000 ug | ORAL_TABLET | Freq: Every day | ORAL | Status: DC
  Administered 2020-06-17 – 2020-06-23 (×6): 50 ug via ORAL
  Filled 2020-06-17 (×7): qty 1

## 2020-06-17 MED ORDER — CALCIUM CARBONATE-VITAMIN D 500-200 MG-UNIT PO TABS
1.0000 | ORAL_TABLET | Freq: Every day | ORAL | Status: DC
  Administered 2020-06-17 – 2020-06-23 (×7): 1 via ORAL
  Filled 2020-06-17 (×7): qty 1

## 2020-06-17 MED ORDER — PANTOPRAZOLE SODIUM 40 MG PO TBEC
40.0000 mg | DELAYED_RELEASE_TABLET | Freq: Every day | ORAL | Status: DC
  Administered 2020-06-17 – 2020-06-23 (×7): 40 mg via ORAL
  Filled 2020-06-17 (×7): qty 1

## 2020-06-17 MED ORDER — ATORVASTATIN CALCIUM 10 MG PO TABS
10.0000 mg | ORAL_TABLET | Freq: Every day | ORAL | Status: DC
  Administered 2020-06-17 – 2020-06-22 (×6): 10 mg via ORAL
  Filled 2020-06-17 (×6): qty 1

## 2020-06-17 MED ORDER — LATANOPROST 0.005 % OP SOLN
1.0000 [drp] | Freq: Every day | OPHTHALMIC | Status: DC
  Administered 2020-06-19 – 2020-06-23 (×5): 1 [drp] via OPHTHALMIC
  Filled 2020-06-17: qty 2.5

## 2020-06-17 MED ORDER — DORZOLAMIDE HCL-TIMOLOL MAL 2-0.5 % OP SOLN
1.0000 [drp] | Freq: Two times a day (BID) | OPHTHALMIC | Status: DC
  Administered 2020-06-17 – 2020-06-23 (×9): 1 [drp] via OPHTHALMIC
  Filled 2020-06-17: qty 10

## 2020-06-17 MED ORDER — SODIUM CHLORIDE 1 G PO TABS
1.0000 g | ORAL_TABLET | Freq: Two times a day (BID) | ORAL | Status: DC
  Administered 2020-06-17 – 2020-06-19 (×4): 1 g via ORAL
  Filled 2020-06-17 (×4): qty 1

## 2020-06-17 MED ORDER — FLUTICASONE PROPIONATE 50 MCG/ACT NA SUSP
1.0000 | Freq: Every day | NASAL | Status: DC
  Administered 2020-06-17 – 2020-06-22 (×6): 1 via NASAL
  Filled 2020-06-17: qty 16

## 2020-06-17 MED ORDER — SALINE SPRAY 0.65 % NA SOLN
1.0000 | NASAL | Status: DC | PRN
  Administered 2020-06-17 – 2020-06-20 (×5): 1 via NASAL
  Filled 2020-06-17: qty 44
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
## 2020-06-17 NOTE — Plan of Care (Signed)
Pt BP elevated.  MD notified.  Pt continues to complain of difficulty swallowing and generalized weakness.  SLP consult completed and MBS scheduled for tomorrow am.   Problem: Clinical Measurements: Goal: Will remain free from infection Outcome: Progressing Goal: Diagnostic test results will improve Outcome: Progressing Goal: Respiratory complications will improve Outcome: Progressing Goal: Cardiovascular complication will be avoided Outcome: Progressing   Problem: Activity: Goal: Risk for activity intolerance will decrease Outcome: Progressing   Problem: Nutrition: Goal: Adequate nutrition will be maintained Outcome: Progressing   Problem: Coping: Goal: Level of anxiety will decrease Outcome: Progressing   Problem: Elimination: Goal: Will not experience complications related to bowel motility Outcome: Progressing Goal: Will not experience complications related to urinary retention Outcome: Progressing   Problem: Pain Managment: Goal: General experience of comfort will improve Outcome: Progressing   Problem: Safety: Goal: Ability to remain free from injury will improve Outcome: Progressing   Problem: Skin Integrity: Goal: Risk for impaired skin integrity will decrease Outcome: Progressing   Problem: Education: Goal: Knowledge of General Education information will improve Description: Including pain rating scale, medication(s)/side effects and non-pharmacologic comfort measures Outcome: Not Progressing   Problem: Health Behavior/Discharge Planning: Goal: Ability to manage health-related needs will improve Outcome: Not Progressing   Problem: Clinical Measurements: Goal: Ability to maintain clinical measurements within normal limits will improve Outcome: Not Progressing
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
## 2020-06-17 NOTE — Progress Notes (Signed)
Pt family member brought home medications to bedside this am.  RN instructed family member to take meds back home as it is against policy for patient home meds to remain at bedside or for pt to take home meds while at hospital unless pharmacy has approved and dispensed the meds.   Pt family member stated that he understood and took medication bottles out of the room.  This afternoon, pt family member was found administering prescription eye drops to patient that he had brought from home.  RN reviewed the policy again with patient's family member and requested that he take all prescription eyedrops and any other home medications back to the patient's residence.

## 2020-06-18 ENCOUNTER — Inpatient Hospital Stay (HOSPITAL_COMMUNITY): Payer: Medicare Other

## 2020-06-18 DIAGNOSIS — G629 Polyneuropathy, unspecified: Secondary | ICD-10-CM

## 2020-06-18 LAB — BASIC METABOLIC PANEL
Anion gap: 9 (ref 5–15)
BUN: 33 mg/dL — ABNORMAL HIGH (ref 8–23)
CO2: 29 mmol/L (ref 22–32)
Calcium: 9.2 mg/dL (ref 8.9–10.3)
Chloride: 91 mmol/L — ABNORMAL LOW (ref 98–111)
Creatinine, Ser: 0.76 mg/dL (ref 0.44–1.00)
GFR calc Af Amer: >60 mL/min (ref >60)
GFR calc non Af Amer: >60 mL/min (ref >60)
Glucose, Bld: 110 mg/dL — ABNORMAL HIGH (ref 70–99)
Potassium: 4.5 mmol/L (ref 3.5–5.1)
Sodium: 129 mmol/L — ABNORMAL LOW (ref 135–145)
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
## 2020-06-18 NOTE — Plan of Care (Signed)
Pt VS WNL this am.  Able to stand for transfer to [facility].  No complaints at this time.   Problem: Education: Goal: Knowledge of General Education information will improve Description: Including pain rating scale, medication(s)/side effects and non-pharmacologic comfort measures Outcome: Progressing   Problem: Health Behavior/Discharge Planning: Goal: Ability to manage health-related needs will improve Outcome: Progressing   Problem: Clinical Measurements: Goal: Ability to maintain clinical measurements within normal limits will improve Outcome: Progressing Goal: Will remain free from infection Outcome: Progressing Goal: Diagnostic test results will improve Outcome: Progressing Goal: Respiratory complications will improve Outcome: Progressing Goal: Cardiovascular complication will be avoided Outcome: Progressing   Problem: Activity: Goal: Risk for activity intolerance will decrease Outcome: Progressing   Problem: Nutrition: Goal: Adequate nutrition will be maintained Outcome: Progressing   Problem: Coping: Goal: Level of anxiety will decrease Outcome: Progressing   Problem: Elimination: Goal: Will not experience complications related to bowel motility Outcome: Progressing Goal: Will not experience complications related to urinary retention Outcome: Progressing   Problem: Pain Managment: Goal: General experience of comfort will improve Outcome: Progressing   Problem: Safety: Goal: Ability to remain free from injury will improve Outcome: Progressing   Problem: Skin Integrity: Goal: Risk for impaired skin integrity will decrease Outcome: Progressing

## 2020-06-18 NOTE — Progress Notes (Addendum)
PROGRESS NOTE    Brenda Simpson  ZOX:[id number]  DOB: 04-12-1941  PCP: Ardyth GalYbanez, Jane, MD Admit date:06/15/2020 Chief compliant: dyspnea, dizziness 79 y.o. female with medical history significant of hypothyroidism, HLD who presents to the ED with 1 week h/o SOB, generalized weakness.  Dizziness with movement.  Pt has had normal PO intake during this time. Relevant  recent history:[facility].  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  Admission for biopsy was recommended at that time however pt declined this according to notes. Subsequently her PCP saw her in follow up at end of May, [facility] regarding these CT findings.  She hasnt seen pulm yet. ED Course: Afebrile,Sodium of 119  Was [facility].  Creat nl, bicarb nl.EDP gave 1L NS bolus.CTA in chest shows pleural nodule / lobular mass findings as below.  Urine sodium 95, urine SG 1.010.  Urine and serum Osm pending on admission Hospital course: [facility].   Subjective:  Patient appears much improved today, less anxious and improvement in frequency of involuntary movements/tremors on her right side.  Objective: Vitals:   06/17/20 1825 06/17/20 2007 06/18/20 0651 06/18/20 1243  BP: (!) 146/94 (!) 142/88 135/81 (!) 109/96  Pulse: 87 85 76 77  Resp:  17 20 18   Temp:  97.9 F (36.6 C) 99.2 F (37.3 C) 99.3 F (37.4 C)  TempSrc:  Oral Oral Oral  SpO2: 96% 96% 97% 98%  Weight:      Height:        Intake/Output Summary (Last 24 hours) at 06/18/2020 1513 Last data filed at 06/18/2020 0700 Gross per 24 hour  Intake --  Output 725 ml  Net -725 ml   Filed Weights   06/16/20 2025  Weight: 45.3 kg    Physical Examination: General: Thin built, no acute distress noted Head ENT: Small left eye (history of  glaucoma) and ?anisocoric, involuntary head/right upper extremity shoulder movements improved.  Has dentures Heart: S1-S2 heard, regular rate and rhythm, no murmurs.  No leg edema noted Lungs: Equal air entry bilaterally, no rhonchi or rales on exam, no accessory muscle use Abdomen: Bowel sounds heard, soft, nontender, nondistended. No organomegaly.  No CVA tenderness Extremities: No pedal edema.  No cyanosis or clubbing. Neurological: Awake alert oriented x3, no focal weakness.  Does have decreased sensations to crude touch in bilateral feet with chronic neuropathy.  Nonspecific intermittent tremors/involuntary movements as discussed above. Mood: Less anxious today  skin: No wounds or rashes.     Data Reviewed: I have personally reviewed following labs and imaging studies  CBC: Recent Labs  Lab 06/15/20 1831  WBC 9.6  NEUTROABS 8.2*  HGB 13.7  HCT 37.5  MCV 88.7  PLT 144*   Basic Metabolic Panel: Recent Labs  Lab 06/16/20 1119 06/16/20 1646 06/16/20 2317 06/17/20 0604 06/18/20 1130  NA 127* 127* 129* 132* 129*  K 4.3 4.8 4.4 4.4 4.5  CL 93* 92* 95* 97* 91*  CO2 24 29 23 23 29   GLUCOSE 126* 109* 123* 111* 110*  BUN 15 17 23 17  33*  CREATININE 0.63 0.73 0.86 0.65 0.76  CALCIUM 8.5* 8.5* 8.8* 9.2 9.2   GFR: Estimated Creatinine Clearance: 41.4 mL/min (by C-G formula based on SCr of 0.76 mg/dL). Liver Function Tests: Recent Labs  Lab 06/15/20 1831  AST 27  ALT 23  ALKPHOS 44  BILITOT 1.0  PROT 6.8  ALBUMIN 3.8   No results for input(s): LIPASE, AMYLASE in the last 168 hours. No results for input(s): AMMONIA in the last 168 hours. Coagulation Profile: No results for input(s): INR, PROTIME in the last 168 hours. Cardiac Enzymes: No results for input(s): CKTOTAL, CKMB, CKMBINDEX, TROPONINI in the last 168 hours. BNP (last 3 results) No results for input(s): PROBNP in the last 8760 hours. HbA1C: No results for input(s): HGBA1C in the last 72 hours. CBG: No  results for input(s): GLUCAP in the last 168 hours. Lipid Profile: No results for input(s): CHOL, HDL, LDLCALC, TRIG, CHOLHDL, LDLDIRECT in the last 72 hours. Thyroid Function Tests: Recent Labs    06/16/20 0135  TSH 2.385   Anemia Panel: No results for input(s): VITAMINB12, FOLATE, FERRITIN, TIBC, IRON, RETICCTPCT in the last 72 hours. Sepsis Labs: No results for input(s): PROCALCITON, LATICACIDVEN in the last 168 hours.  Recent Results (from the past 240 hour(s))  SARS Coronavirus 2 by RT PCR (hospital order, performed in [facility] hospital lab) Nasopharyngeal Nasopharyngeal Swab     Status: None   Collection Time: 06/15/20  9:49 PM   Specimen: Nasopharyngeal Swab  Result Value Ref Range Status   SARS Coronavirus 2 NEGATIVE NEGATIVE Final    Comment: (NOTE) SARS-CoV-2 target nucleic acids are NOT DETECTED.  The SARS-CoV-2 RNA is generally detectable in upper and lower respiratory specimens during the acute phase of infection. The lowest concentration of SARS-CoV-2 viral copies this assay can detect is 250 copies / mL. A negative result does not preclude SARS-CoV-2 infection and should not be used as the sole basis for treatment or other patient management decisions.  A negative result may occur with improper specimen collection / handling, submission of specimen other than nasopharyngeal swab, presence of viral mutation(s) within the areas targeted by this assay, and inadequate number of viral copies (<250 copies / mL). A negative result must be combined with clinical observations, patient history, and epidemiological information.  Fact Sheet for Patients:   BoilerBrush.com.cy  Fact Sheet for Healthcare Providers: https://pope.com/  This test is not yet approved or  cleared by the [place] FDA and has been authorized for detection and/or diagnosis of SARS-CoV-2 by FDA under an Emergency Use Authorization (EUA).  This  EUA will remain in effect (meaning this test can be used) for the duration of the COVID-19 declaration under Section 564(b)(1) of the Act, 21 U.S.C. section 360bbb-3(b)(1), unless the authorization is terminated or revoked sooner.  Performed at [facility], [address], [place], [place] [address]       Radiology Studies: CT HEAD WO CONTRAST  Result Date: 06/17/2020 CLINICAL DATA:  Mental status change EXAM: CT HEAD WITHOUT CONTRAST TECHNIQUE: Contiguous axial images were obtained from the base of the skull through the vertex without intravenous contrast. COMPARISON:  CT a head 12/11/2017 FINDINGS: Brain: Mild atrophy without hydrocephalus. Progression of chronic microvascular ischemic changes in the white matter diffusely. Small chronic infarct left cerebellum unchanged. Negative for acute infarct, hemorrhage, mass Vascular: Negative for hyperdense vessel Skull: Surgical wires are present along the posterior zygomatic arch bilaterally. There are extensive erosive changes of the mandibular condyle bilaterally. Bony irregularity of the mandibular ramus bilaterally consistent with prior fracture. Chronic fracture of C1 on the left anteriorly and posteriorly. This is not seen on the prior CT cervical spine of 12/11/2017 Sinuses/Orbits: Paranasal sinuses clear. Mastoid  clear. Left cataract extraction. Other: None IMPRESSION: 1. No acute intracranial abnormality. Atrophy and chronic microvascular ischemic changes. 2. Chronic fractures of the mandible bilaterally with extensive erosive changes in the mandibular condyle bilaterally. This is unchanged 3. Nondisplaced fracture of C1 anteriorly and posteriorly on the left, not seen on the prior CT. These appear nonacute. Correlate with any recent trauma history. Electronically Signed   By: Marlan Palau M.D.   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 Got 1L NS bolus by EDP.  Being treated with fluid restriction to and also started on salt tablets 1 g 3 times daily over the weekend-reduce to 1 g twice daily yesterday.  Sodium level down to 129 again today.  Will repeat in a.m., if still low will increase salt tablets to 3 times daily again. Urine osmolality normal at 371, low serum osmolality at 269.  Initially her sodium level improved from 1 19-1 26 within 7 hours, thereafter remained steady at 127-128 for next 24 hours.  Went up to 132 yesterday and down at 129 today.   H/o hypothyroidism -Cont synthroid.Nl TSH.Was euthyroid with treatment as of March labs.  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  Appears to be intermittent.  Avoid dopamine agonists.  Unclear baseline as she does have a history of neuropathy, and also uses gabapentin (can cause myoclonus, tremors).  Dosage reduced back to 300 mg of gabapentin.  Resumed home medications per patient's request.  Dysphagia: Seen by speech therapy and follow-up today and advance diet to dysphagia 2,  meds to be crushed in applesauce but felt to be high aspiration risk.  She also reports difficulty chewing given problems with her dentures.  Speech therapy continues to follow  Glaucoma: Resumed home medications.  Anxiety: Patient appeared very anxious on 7/12, at times hyperventilating during conversation.  Klonopin low-dose ordered for as needed use and she appears much improved.  Underweight: Based on the BMI of 16.12 and supporting information  Ht 5'6" Wt 45.3 Kg-- Ensure supplement BID  DVT prophylaxis: Lovenox Code Status: Full code Family / Patient Communication: Son was at bedside earlier per nursing staff, but left for the day.  Called for update -could not reach-left a message Disposition Plan:   Status is: Inpatient  Remains inpatient appropriate because:Ongoing diagnostic testing needed not appropriate for outpatient work up   Dispo: The patient is from: Home  Anticipated d/c is to: SNF              Anticipated d/c date is: 2 days              Patient currently is not medically stable to d/c.       Time spent: 25 minutes  >50% time spent in discussions with care team and coordination of care.    Alessandra Bevels, MD [facility] Pager in [place]  If 7PM-7AM, please contact night-coverage [email or web address] 06/17/2020, 5:46 PM
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
## 2020-06-18 NOTE — Progress Notes (Signed)
Modified Barium Swallow Progress Note  Patient Details  Name: Brenda Simpson MRN: [id number] Date of Birth: October 30, 1941  Today's Date: 06/18/2020  Modified Barium Swallow completed.  Full report located under Chart Review in the Imaging Section.  Brief recommendations include the following:  Clinical Impression  Pt presents with mild oral dysphagia due to weakness/lack of upper dentition.  No aspiration or penetration of any consistency tested noted.  When provided pill orally, pt pushed pill to posterior oral cavity using tongue with delay.  She was able to finish the tablet transit with liquids.  In addition, decreased labial seal and lingual strength resulting in delayed drawing of liquids into straw.   Pt noted during evaluation to sniff continuously *(as observed yesterday), which may allow episodic aspiratoin of intake. Advised her strongly against this behavior to help protect her airway.  Recommend dys2/thin with strict precautions.  Will follow briefly to assure po tolerance and for education with compensation review.   Swallow Evaluation Recommendations       SLP Diet Recommendations: Dysphagia 2 (Fine chop) solids;Thin liquid   Liquid Administration via: Cup;No straw   Medication Administration:  (as tolerated)  With plenty of liquids   Supervision: Patient able to self feed;Full supervision/cueing for compensatory strategies   Compensations: Slow rate;Small sips/bites   Postural Changes: Remain semi-upright after after feeds/meals (Comment);Seated upright at 90 degrees   Oral Care Recommendations: Oral care BID     Rolena Infante, MS [facility] SLP Acute Rehab Services Office [phone]    Chales Abrahams 06/18/2020,10:03 AM

## 2020-06-19 LAB — BASIC METABOLIC PANEL
Anion gap: 10 (ref 5–15)
BUN: 38 mg/dL — ABNORMAL HIGH (ref 8–23)
CO2: 26 mmol/L (ref 22–32)
Calcium: 9.1 mg/dL (ref 8.9–10.3)
Chloride: 93 mmol/L — ABNORMAL LOW (ref 98–111)
Creatinine, Ser: 0.86 mg/dL (ref 0.44–1.00)
GFR calc Af Amer: >60 mL/min (ref >60)
GFR calc non Af Amer: >60 mL/min (ref >60)
Glucose, Bld: 151 mg/dL — ABNORMAL HIGH (ref 70–99)
Potassium: 4.4 mmol/L (ref 3.5–5.1)
Sodium: 129 mmol/L — ABNORMAL LOW (ref 135–145)

## 2020-06-19 MED ORDER — ALUM & MAG HYDROXIDE-SIMETH 200-200-20 MG/5ML PO SUSP
30.0000 mL | ORAL | Status: DC | PRN
  Administered 2020-06-19: 30 mL via ORAL
  Filled 2020-06-19: qty 30

## 2020-06-19 MED ORDER — SODIUM CHLORIDE 1 G PO TABS
1.0000 g | ORAL_TABLET | Freq: Three times a day (TID) | ORAL | Status: DC
  Administered 2020-06-19 – 2020-06-23 (×12): 1 g via ORAL
  Filled 2020-06-19 (×13): qty 1

## 2020-06-19 MED ORDER — MELATONIN 5 MG PO TABS
5.0000 mg | ORAL_TABLET | Freq: Once | ORAL | Status: AC
  Administered 2020-06-19: 5 mg via ORAL
  Filled 2020-06-19: qty 1
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
## 2020-06-19 NOTE — Progress Notes (Signed)
PT Cancellation Note  Patient Details Name: Brenda Simpson MRN: [id number] DOB: 1940-12-25   Cancelled Treatment:    Reason Eval/Treat Not Completed: Patient at procedure or test/unavailable (speech therapy is working with pt. Will follow.)  Tamala Ser PT 06/19/2020  Acute Rehabilitation Services Pager [phone] Office [phone]

## 2020-06-19 NOTE — Care Management Important Message (Signed)
Important Message  Patient Details IM Letter given to Sandford Craze RN Case Manager to present to the Patient Name: Brenda Simpson MRN: [id number] Date of Birth: 1941/03/12   Medicare Important Message Given:  Yes     Caren Macadam 06/19/2020, 9:46 AM
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
## 2020-06-20 LAB — BASIC METABOLIC PANEL
Anion gap: 9 (ref 5–15)
BUN: 27 mg/dL — ABNORMAL HIGH (ref 8–23)
CO2: 28 mmol/L (ref 22–32)
Calcium: 8.9 mg/dL (ref 8.9–10.3)
Chloride: 94 mmol/L — ABNORMAL LOW (ref 98–111)
Creatinine, Ser: 0.65 mg/dL (ref 0.44–1.00)
GFR calc Af Amer: >60 mL/min (ref >60)
GFR calc non Af Amer: >60 mL/min (ref >60)
Glucose, Bld: 109 mg/dL — ABNORMAL HIGH (ref 70–99)
Potassium: 4.3 mmol/L (ref 3.5–5.1)
Sodium: 131 mmol/L — ABNORMAL LOW (ref 135–145)
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
## 2020-06-20 NOTE — NC FL2 (Signed)
[place] MEDICAID FL2 LEVEL OF CARE SCREENING TOOL     IDENTIFICATION  Patient Name: Brenda Simpson Birthdate: 1941/06/01 Sex: female Admission Date (Current Location): 06/15/2020  [facility] and [place] Number:  Producer, television/film/video and Address:  [facility],  [address], [place] [address]      Provider Number: [id number]  Attending Physician Name and Address:  Alessandra Bevels, MD  Relative Name and Phone Number:       Current Level of Care: Hospital Recommended Level of Care: Skilled Nursing Facility Prior Approval Number:    Date Approved/Denied:   PASRR Number: [id number]  Discharge Plan: SNF    Current Diagnoses: Patient Active Problem List   Diagnosis Date Noted  . 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 Pleural mass 06/16/2020  . Hyponatremia 06/15/2020  . Other allergic rhinitis 01/27/2017  . Seasonal allergic conjunctivitis 01/27/2017  . Gastroesophageal reflux disease without esophagitis 01/27/2017  . Other osteoporosis without current pathological fracture 01/27/2017    Orientation RESPIRATION BLADDER Height & Weight     Self, Time, Situation, Place  Normal Incontinent Weight: 45.3 kg Height:  5\' 6"  (167.6 cm)  BEHAVIORAL SYMPTOMS/MOOD NEUROLOGICAL BOWEL NUTRITION STATUS      Continent Diet (dysphagia 2, pureed meats)  AMBULATORY STATUS COMMUNICATION OF NEEDS Skin   Extensive Assist Verbally Normal                       Personal Care Assistance Level of Assistance  Bathing, Dressing Bathing Assistance: Limited assistance   Dressing Assistance: Limited assistance     Functional Limitations Info  Sight Sight Info: Impaired        SPECIAL CARE FACTORS FREQUENCY  PT (By licensed PT), OT (By licensed OT)     PT Frequency: 5 x weekly OT Frequency: 5 x weekly            Contractures Contractures Info: Not present    Additional Factors Info  Code Status, Allergies Code Status  Info: Full Allergies Info: Butorphanol Tartrate, Esomeprazole Magnesium, Ibuprofen, Stadol Butorphanol Tartrate, Cetirizine           Current Medications (06/20/2020):  This is the current hospital active medication list Current Facility-Administered Medications  Medication Dose Route Frequency Provider Last Rate Last Admin  . acetaminophen (TYLENOL) tablet 650 mg  650 mg Oral Q6H PRN 06/22/2020, DO   650 mg at 06/16/20 1753   Or  . acetaminophen (TYLENOL) suppository 650 mg  650 mg Rectal Q6H PRN 08/17/20, DO      . alum & mag hydroxide-simeth (MAALOX/MYLANTA) 200-200-20 MG/5ML suspension 30 mL  30 mL Oral Q4H PRN 06-15-2001, MD   30 mL at 06/19/20 2203  . aspirin EC tablet 81 mg  81 mg Oral Daily 2204, MD   81 mg at 06/20/20 1015  . atorvastatin (LIPITOR) tablet 10 mg  10 mg Oral QHS 06/22/20, MD   10 mg at 06/19/20 2133  . bisacodyl (DULCOLAX) suppository 10 mg  10 mg Rectal Daily PRN 2134, MD   10 mg at 06/17/20 2135  . calcium-vitamin D (OSCAL WITH D) 500-200 MG-UNIT per tablet 1 tablet  1 tablet Oral Daily 2136, MD   1 tablet at 06/20/20 1015  . clonazepam (KLONOPIN) disintegrating tablet 0.25 mg  0.25 mg Oral BID PRN 06/22/20, MD   0.25 mg at 06/19/20 2134  . dorzolamide-timolol (COSOPT) 22.3-6.8 MG/ML ophthalmic solution 1  drop  1 drop Both Eyes BID Alessandra Bevels, MD   1 drop at 06/20/20 1016  . enoxaparin (LOVENOX) injection 40 mg  40 mg Subcutaneous Q24H Lyda Perone M, DO   40 mg at 06/20/20 1015  . feeding supplement (ENSURE ENLIVE) (ENSURE ENLIVE) liquid 237 mL  237 mL Oral BID BM Marzetta Board, MD   237 mL at 06/20/20 1018  . fluticasone (FLONASE) 50 MCG/ACT nasal spray 1 spray  1 spray Each Nare QHS Alessandra Bevels, MD   1 spray at 06/19/20 2133  . gabapentin (NEURONTIN) capsule 300 mg  300 mg Oral BID Alessandra Bevels, MD   300 mg at 06/20/20 1015  . hyoscyamine (LEVSIN) 0.125  MG/5ML elixir 0.25 mg  0.25 mg Oral Q6H PRN Cindi Carbon, RPH      . latanoprost (XALATAN) 0.005 % ophthalmic solution 1 drop  1 drop Left Eye Daily Alessandra Bevels, MD   1 drop at 06/20/20 1016  . levothyroxine (SYNTHROID) tablet 50 mcg  50 mcg Oral Daily Alessandra Bevels, MD   50 mcg at 06/20/20 0555  . ondansetron (ZOFRAN) tablet 4 mg  4 mg Oral Q6H PRN Hillary Bow, DO       Or  . ondansetron [facility]) injection 4 mg  4 mg Intravenous Q6H PRN Hillary Bow, DO      . pantoprazole (PROTONIX) EC tablet 40 mg  40 mg Oral Daily Alessandra Bevels, MD   40 mg at 06/20/20 1019  . sodium chloride (OCEAN) 0.65 % nasal spray 1 spray  1 spray Each Nare PRN Alessandra Bevels, MD   1 spray at 06/18/20 2126  . sodium chloride tablet 1 g  1 g Oral TID WC Alessandra Bevels, MD   1 g at 06/20/20 1219     Discharge Medications: Please see discharge summary for a list of discharge medications.  Relevant Imaging Results:  Relevant Lab Results:   Additional Information ss# [id number]  Ryeleigh Santore, Meriam Sprague, RN
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
## 2020-06-20 NOTE — TOC Progression Note (Signed)
Transition of Care [facility]) - Progression Note    Patient Details  Name: Brenda Simpson MRN: [id number] Date of Birth: 05-Jan-1941  Transition of Care [facility]) CM/SW Contact  Nakyiah Kuck, Meriam Sprague, RN Phone Number: 06/20/2020, 1:47 PM  Clinical Narrative:    Spoke with pt and son at bedside again today. Son feels that pt is deconditioned more and now needs to go to SNF. FL2 faxed out and auth started with [facility]. Ref # [id number].   Expected Discharge Plan: Skilled Nursing Facility Barriers to Discharge: Continued Medical Work up  Expected Discharge Plan and Services Expected Discharge Plan: Skilled Nursing Facility   Discharge Planning Services: CM Consult Post Acute Care Choice: Home Health                   DME Arranged: 3-N-1, Walker rolling DME Agency: [employer] Date DME Agency Contacted: 06/18/20 Time DME Agency Contacted: [phone] Representative spoke with at DME Agency: Zach  Readmission Risk Interventions No flowsheet data found.

## 2020-06-21 ENCOUNTER — Inpatient Hospital Stay (HOSPITAL_COMMUNITY): Payer: Medicare Other

## 2020-06-21 DIAGNOSIS — R0600 Dyspnea, unspecified: Secondary | ICD-10-CM

## 2020-06-21 LAB — ECHOCARDIOGRAM COMPLETE
Area-P 1/2: 1.96 cm2
Height: 66 in
S' Lateral: 2.1 cm
Weight: 1597.89 oz

## 2020-06-21 LAB — BASIC METABOLIC PANEL
Anion gap: 10 (ref 5–15)
BUN: 20 mg/dL (ref 8–23)
CO2: 25 mmol/L (ref 22–32)
Calcium: 9.2 mg/dL (ref 8.9–10.3)
Chloride: 94 mmol/L — ABNORMAL LOW (ref 98–111)
Creatinine, Ser: 0.6 mg/dL (ref 0.44–1.00)
GFR calc Af Amer: >60 mL/min (ref >60)
GFR calc non Af Amer: >60 mL/min (ref >60)
Glucose, Bld: 104 mg/dL — ABNORMAL HIGH (ref 70–99)
Potassium: 4.4 mmol/L (ref 3.5–5.1)
Sodium: 129 mmol/L — ABNORMAL LOW (ref 135–145)

## 2020-06-21 LAB — BRAIN NATRIURETIC PEPTIDE: B Natriuretic Peptide: 67.1 pg/mL (ref 0.0–100.0)

## 2020-06-21 LAB — CORTISOL: Cortisol, Plasma: 18.4 ug/dL

## 2020-06-21 MED ORDER — LACTULOSE 10 GM/15ML PO SOLN
20.0000 g | Freq: Once | ORAL | Status: AC
  Administered 2020-06-21: 20 g via ORAL
  Filled 2020-06-21: qty 30

## 2020-06-21 MED ORDER — SUCRALFATE 1 G PO TABS
1.0000 g | ORAL_TABLET | Freq: Three times a day (TID) | ORAL | Status: DC
  Administered 2020-06-21 – 2020-06-23 (×8): 1 g via ORAL
  Filled 2020-06-21 (×9): qty 1

## 2020-06-21 MED ORDER — FUROSEMIDE 10 MG/ML IJ SOLN
20.0000 mg | Freq: Once | INTRAMUSCULAR | Status: AC
  Administered 2020-06-21: 20 mg via INTRAVENOUS
  Filled 2020-06-21: qty 2
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
## 2020-06-21 NOTE — TOC Progression Note (Addendum)
Transition of Care [facility]) - Progression Note    Patient Details  Name: Catrina Fellenz MRN: [id number] Date of Birth: [birth year]  Transition of [facility] CM/SW Contact  Mikele Sifuentes, Meriam Sprague, RN Phone Number: 06/21/2020, 12:16 PM  Clinical Narrative:    SNF bed offers given to son. [facility] chosen. Liaison contacted to accept bed. Pt has not had Covid vaccines so it was explained to son that pt would be in quarantine when she goes to SNF. Pt does not need another Covid test for SNF. Auth received #[id number].   Expected Discharge Plan: Skilled Nursing Facility Barriers to Discharge: Continued Medical Work up  Expected Discharge Plan and Services Expected Discharge Plan: Skilled Nursing Facility   Discharge Planning Services: CM Consult Post Acute Care Choice: Home Health                   DME Arranged: 3-N-1, Walker rolling DME Agency: [employer] Date DME Agency Contacted: 06/18/20 Time DME Agency Contacted: 74 Representative spoke with at DME Agency: Ian Malkin HH Arranged: PT, OT HH Agency: [facility] Date [facility] Agency Contacted: 06/18/20 Time HH Agency Contacted: 1300 Representative spoke with at [facility] Agency: Kandee Keen   Social Determinants of Health (SDOH) Interventions    Readmission Risk Interventions No flowsheet data found.

## 2020-06-21 NOTE — Progress Notes (Signed)
°  Echocardiogram 2D Echocardiogram has been performed.  Brenda Simpson 06/21/2020, 3:11 PM

## 2020-06-21 NOTE — TOC Transition Note (Signed)
Transition of Care [facility]) - CM/SW Discharge Note   Patient Details  Name: Brenda Simpson MRN: [id number] Date of Birth: [birth year]  Transition of Care [facility]) CM/SW Contact:  Bartholome Bill, RN Phone Number: 06/21/2020, 4:09 PM   Clinical Narrative:     [facility] now can not take patient. [name] is second choice. Left VM with [facility] to accept bed. Covid test to be done today. Will need to update [facility] if [place] can take her.    Barriers to Discharge: Continued Medical Work up   Patient Goals and CMS Choice Patient states their goals for this hospitalization and ongoing recovery are:: to get better CMS Medicare.gov Compare Post Acute Care list provided to:: Patient Choice offered to / list presented to : Patient  Discharge Placement                       Discharge Plan and Services   Discharge Planning Services: CM Consult Post Acute Care Choice: Home Health          DME Arranged: 3-N-1, Walker rolling DME Agency: [employer] Date DME Agency Contacted: 06/18/20 Time DME Agency Contacted: 68 Representative spoke with at DME Agency: Ian Malkin HH Arranged: PT, OT HH Agency: [facility] Date [facility] Agency Contacted: 06/18/20 Time HH Agency Contacted: 1300 Representative spoke with at [facility] Agency: Kandee Keen  Social Determinants of Health (SDOH) Interventions     Readmission Risk Interventions No flowsheet data found.

## 2020-06-22 DIAGNOSIS — R0600 Dyspnea, unspecified: Secondary | ICD-10-CM

## 2020-06-22 DIAGNOSIS — R112 Nausea with vomiting, unspecified: Secondary | ICD-10-CM

## 2020-06-22 LAB — BASIC METABOLIC PANEL
Anion gap: 10 (ref 5–15)
Anion gap: 10 (ref 5–15)
BUN: 20 mg/dL (ref 8–23)
BUN: 26 mg/dL — ABNORMAL HIGH (ref 8–23)
CO2: 27 mmol/L (ref 22–32)
CO2: 24 mmol/L (ref 22–32)
Calcium: 9.5 mg/dL (ref 8.9–10.3)
Calcium: 8.9 mg/dL (ref 8.9–10.3)
Chloride: 89 mmol/L — ABNORMAL LOW (ref 98–111)
Chloride: 92 mmol/L — ABNORMAL LOW (ref 98–111)
Creatinine, Ser: 0.72 mg/dL (ref 0.44–1.00)
Creatinine, Ser: 0.73 mg/dL (ref 0.44–1.00)
GFR calc Af Amer: >60 mL/min (ref >60)
GFR calc Af Amer: >60 mL/min (ref >60)
GFR calc non Af Amer: >60 mL/min (ref >60)
GFR calc non Af Amer: >60 mL/min (ref >60)
Glucose, Bld: 158 mg/dL — ABNORMAL HIGH (ref 70–99)
Glucose, Bld: 154 mg/dL — ABNORMAL HIGH (ref 70–99)
Potassium: 5.2 mmol/L — ABNORMAL HIGH (ref 3.5–5.1)
Potassium: 4.7 mmol/L (ref 3.5–5.1)
Sodium: 126 mmol/L — ABNORMAL LOW (ref 135–145)
Sodium: 126 mmol/L — ABNORMAL LOW (ref 135–145)

## 2020-06-22 LAB — SARS CORONAVIRUS 2 BY RT PCR (HOSPITAL ORDER, PERFORMED IN ~~LOC~~ HOSPITAL LAB): SARS Coronavirus 2: NEGATIVE

## 2020-06-22 MED ORDER — CLONAZEPAM 0.125 MG PO TBDP
0.2500 mg | ORAL_TABLET | Freq: Three times a day (TID) | ORAL | Status: DC | PRN
  Administered 2020-06-22 – 2020-06-23 (×2): 0.25 mg via ORAL
  Filled 2020-06-22 (×2): qty 2

## 2020-06-22 NOTE — TOC Progression Note (Addendum)
Transition of Care [facility]) - Progression Note    Patient Details  Name: Brenda Simpson MRN: [id number] Date of Birth: 08-Feb-1941  Transition [facility] Care [facility]) CM/SW Contact  Darleene Cleaver, [place] Phone Number:  06/22/2020, 10:10 AM  Clinical Narrative:     CSW attempted to contact admissons worker at [facility], CSW left a message on voice mail awaiting for a call back.  11:15am  CSW received call back from [place], they can accept patient today if she is medically ready.  Insurance authorization is [id number], patient is approved for four days, next review date is June 25, 2020.  CSW to continue to follow patient's progress throughout discharge planning.    Expected Discharge Plan: Skilled Nursing Facility Barriers to Discharge: Continued Medical Work up  Expected Discharge Plan and Services Expected Discharge Plan: Skilled Nursing Facility   Discharge Planning Services: CM Consult Post Acute Care Choice: Home Health                   DME Arranged: 3-N-1, Walker rolling DME Agency: [employer] Date DME Agency Contacted: 06/18/20 Time DME Agency Contacted: 14 Representative spoke with at DME Agency: Ian Malkin HH Arranged: PT, OT HH Agency: [facility] Date [facility] Agency Contacted: 06/18/20 Time HH Agency Contacted: 1300 Representative spoke with at [facility] Agency: Kandee Keen   Social Determinants of Health (SDOH) Interventions    Readmission Risk Interventions No flowsheet data found.

## 2020-06-22 NOTE — Progress Notes (Signed)
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 organomegaly or masses felt. Normal bowel sounds heard. Central nervous system: Alert, . No focal neurological deficits. Extremities: thin but wwp Skin: No rashes, lesions or ulcers Psychiatry: Judgement and insight appear normal. Mood & affect appropriate.     Data Reviewed: I have personally reviewed following labs and imaging studies  CBC: Recent Labs  Lab 06/15/20 1831  WBC 9.6  NEUTROABS 8.2*  HGB 13.7  HCT 37.5  MCV 88.7  PLT 144*   Basic Metabolic Panel: Recent Labs  Lab 06/18/20 1130 06/19/20 0655 06/20/20 0549 06/21/20 0545 06/22/20 0623  NA 129* 129* 131* 129* 126*  K 4.5 4.4 4.3 4.4 5.2*  CL 91* 93* 94* 94* 89*  CO2 GLUCOSE 110* 151* 109* 104* 158*  BUN 33* 38* 27* 20 20  CREATININE 0.76 0.86 0.65 0.60 0.72  CALCIUM 9.2 9.1 8.9 9.2 9.5   GFR: Estimated Creatinine Clearance: 41.4 mL/min (by C-G formula based on SCr of 0.72 mg/dL). Liver Function Tests: Recent Labs  Lab 06/15/20 1831  AST 27  ALT 23  ALKPHOS 44  BILITOT 1.0  PROT 6.8  ALBUMIN 3.8   No results for input(s): LIPASE, AMYLASE in the last 168 hours. No results for input(s): AMMONIA in the last 168 hours. Coagulation Profile: No results for input(s): INR, PROTIME in the last 168 hours. Cardiac  Enzymes: No results for input(s): CKTOTAL, CKMB, CKMBINDEX, TROPONINI in the last 168 hours. BNP (last 3 results) No results for input(s): PROBNP in the last 8760 hours. HbA1C: No results for input(s): HGBA1C in the last 72 hours. CBG: No results for input(s): GLUCAP in the last 168 hours. Lipid Profile: No results for input(s): CHOL, HDL, LDLCALC, TRIG, CHOLHDL, LDLDIRECT in the last 72 hours. Thyroid Function Tests: No results for input(s): TSH, T4TOTAL, FREET4, T3FREE, THYROIDAB in the last 72 hours. Anemia Panel: No results for input(s): VITAMINB12, FOLATE, FERRITIN, TIBC, IRON, RETICCTPCT in the last 72 hours. Sepsis Labs: No results for input(s): PROCALCITON, LATICACIDVEN in the last 168 hours.  Recent Results (from the past 240 hour(s))  SARS Coronavirus 2 by RT PCR (hospital order, performed in [facility] hospital lab) Nasopharyngeal Nasopharyngeal Swab     Status: None   Collection Time: 06/15/20  9:49 PM   Specimen: Nasopharyngeal Swab  Result Value Ref Range Status   SARS Coronavirus 2 NEGATIVE NEGATIVE Final    Comment: (NOTE) SARS-CoV-2 target nucleic acids are NOT DETECTED.  The SARS-CoV-2 RNA is generally detectable in upper and lower respiratory specimens during the acute phase of infection. The lowest concentration of SARS-CoV-2 viral copies this assay can detect is 250 copies / mL. A negative result does not preclude SARS-CoV-2 infection and should not be used as the sole basis for treatment or other patient management decisions.  A negative result may occur with improper specimen collection / handling, submission of specimen other than nasopharyngeal swab, presence of viral mutation(s) within the areas targeted by this assay, and inadequate number of viral copies (<250 copies / mL). A negative result must be combined with clinical observations, patient history, and epidemiological information.  Fact Sheet for Patients:    BoilerBrush.com.cy  Fact Sheet for Healthcare Providers: https://pope.com/  This test is not yet approved or  cleared by the [place] FDA and has been authorized for detection and/or diagnosis of SARS-CoV-2 by FDA under an Emergency Use Authorization (EUA).  This EUA will remain in effect (meaning this test can be used) for the duration of the COVID-19 declaration under Section 564(b)(1) of the Act, 21 U.S.C. section 360bbb-3(b)(1), unless the authorization is terminated or revoked sooner.  Performed at [facility], [address], [place], [place] [address]   SARS Coronavirus 2 by RT PCR (hospital order, performed in [facility] hospital lab) Nasopharyngeal Nasopharyngeal Swab     Status: None  Collection Time: 06/21/20 11:08 PM   Specimen: Nasopharyngeal Swab  Result Value Ref Range Status   SARS Coronavirus 2 NEGATIVE NEGATIVE Final    Comment: (NOTE) SARS-CoV-2 target nucleic acids are NOT DETECTED.  The SARS-CoV-2 RNA is generally detectable in upper and lower respiratory specimens during the acute phase of infection. The lowest concentration of SARS-CoV-2 viral copies this assay can detect is 250 copies / mL. A negative result does not preclude SARS-CoV-2 infection and should not be used as the sole basis for treatment or other patient management decisions.  A negative result may occur with improper specimen collection / handling, submission of specimen other than nasopharyngeal swab, presence of viral mutation(s) within the areas targeted by this assay, and inadequate number of viral copies (<250 copies / mL). A negative result must be combined with clinical observations, patient history, and epidemiological information.  Fact Sheet for Patients:   BoilerBrush.com.cy  Fact Sheet for Healthcare Providers: https://pope.com/  This test is not yet  approved or  cleared by the [place] FDA and has been authorized for detection and/or diagnosis of SARS-CoV-2 by FDA under an Emergency Use Authorization (EUA).  This EUA will remain in effect (meaning this test can be used) for the duration of the COVID-19 declaration under Section 564(b)(1) of the Act, 21 U.S.C. section 360bbb-3(b)(1), unless the authorization is terminated or revoked sooner.  Performed at [facility], [address], [place], [place] [address]          Radiology Studies: DG CHEST PORT 1 VIEW  Result Date: 06/21/2020 CLINICAL DATA:  Dyspnea EXAM: PORTABLE CHEST 1 VIEW COMPARISON:  June 15, 2020 FINDINGS: The heart size and mediastinal contours are within normal limits. Both lungs are clear. No acute osseous abnormality. IMPRESSION: No active disease. Electronically Signed   By: Jonna Clark M.D.   On: 06/21/2020 15:22   DG Abd Portable 1V  Result Date: 06/21/2020 CLINICAL DATA:  Nausea and vomiting. EXAM: PORTABLE ABDOMEN - 1 VIEW COMPARISON:  None FINDINGS: No dilated loops of small or large bowel identified. Moderate stool burden with multiple dense foci within the distal colon and rectum which may represent desiccated stool or partially undigested food matter. Lumbar scoliosis is convex towards the right. IMPRESSION: 1. Nonobstructive bowel gas pattern. 2. Moderate stool burden within the distal colon and rectum which may represent desiccated stool or partially undigested food matter. Electronically Signed   By: Signa Kell M.D.   On: 06/21/2020 12:18   ECHOCARDIOGRAM COMPLETE  Result Date: 06/21/2020    ECHOCARDIOGRAM REPORT   Patient Name:   Brenda Simpson Date of Exam: 06/21/2020 Medical Rec #:  [id number]       Height:       66.0 in Accession #:    [id number]      Weight:       99.9 lb Date of Birth:  04-11-1941       BSA:          1.489 m Patient Age:    79 years        BP:           144/94 mmHg Patient Gender: F               HR:            96 bpm. Exam Location:  Inpatient Procedure: 2D Echo Indications:    Dyspnea R06.00  History:        Patient has no prior history of  Echocardiogram examinations.  Sonographer:    Thurman Coyer RDCS (AE) Referring Phys: [id number] Alessandra Bevels IMPRESSIONS  1. Left ventricular ejection fraction, by estimation, is 60 to 65%. The left ventricle has normal function. The left ventricle has no regional wall motion abnormalities. Left ventricular diastolic parameters are consistent with Grade I diastolic dysfunction (impaired relaxation).  2. Right ventricular systolic function is normal. The right ventricular size is normal. There is normal pulmonary artery systolic pressure.  3. The mitral valve is normal in structure. No evidence of mitral valve regurgitation. No evidence of mitral stenosis.  4. The aortic valve is normal in structure. Aortic valve regurgitation is not visualized. No aortic stenosis is present.  5. The inferior vena cava is normal in size with greater than 50% respiratory variability, suggesting right atrial pressure of 3 mmHg. FINDINGS  Left Ventricle: Left ventricular ejection fraction, by estimation, is 60 to 65%. The left ventricle has normal function. The left ventricle has no regional wall motion abnormalities. The left ventricular internal cavity size was normal in size. There is  no left ventricular hypertrophy. Left ventricular diastolic parameters are consistent with Grade I diastolic dysfunction (impaired relaxation). Right Ventricle: The right ventricular size is normal. No increase in right ventricular wall thickness. Right ventricular systolic function is normal. There is normal pulmonary artery systolic pressure. The tricuspid regurgitant velocity is 2.13 m/s, and  with an assumed right atrial pressure of 3 mmHg, the estimated right ventricular systolic pressure is 21.1 mmHg. Left Atrium: Left atrial size was normal in size. Right Atrium: Right atrial size was normal in size.  Pericardium: There is no evidence of pericardial effusion. Mitral Valve: The mitral valve is normal in structure. Normal mobility of the mitral valve leaflets. No evidence of mitral valve regurgitation. No evidence of mitral valve stenosis. Tricuspid Valve: The tricuspid valve is normal in structure. Tricuspid valve regurgitation is trivial. No evidence of tricuspid stenosis. Aortic Valve: The aortic valve is normal in structure. Aortic valve regurgitation is not visualized. No aortic stenosis is present. Pulmonic Valve: The pulmonic valve was normal in structure. Pulmonic valve regurgitation is not visualized. No evidence of pulmonic stenosis. Aorta: The aortic root is normal in size and structure. Venous: The inferior vena cava is normal in size with greater than 50% respiratory variability, suggesting right atrial pressure of 3 mmHg. IAS/Shunts: No atrial level shunt detected by color flow Doppler.  LEFT VENTRICLE PLAX 2D LVIDd:         2.40 cm  Diastology LVIDs:         2.10 cm  LV e' lateral:   7.40 cm/s LV PW:         0.86 cm  LV E/e' lateral: 7.4 LV IVS:        0.85 cm  LV e' medial:    6.31 cm/s LVOT diam:     2.00 cm  LV E/e' medial:  8.6 LV SV:         58 LV SV Index:   39 LVOT Area:     3.14 cm  RIGHT VENTRICLE RV S prime:     12.10 cm/s TAPSE (M-mode): 1.8 cm LEFT ATRIUM             Index       RIGHT ATRIUM          Index LA diam:        2.20 cm 1.48 cm/m  RA Area:     9.60 cm LA Vol (A2C):  20.5 ml 13.77 ml/m RA Volume:   17.60 ml 11.82 ml/m LA Vol (A4C):   28.2 ml 18.94 ml/m LA Biplane Vol: 23.7 ml 15.92 ml/m  AORTIC VALVE LVOT Vmax:   104.00 cm/s LVOT Vmean:  72.900 cm/s LVOT VTI:    0.184 m MITRAL VALVE               TRICUSPID VALVE MV Area (PHT): 1.96 cm    TR Peak grad:   18.1 mmHg MV Decel Time: 388 msec    TR Vmax:        213.00 cm/s MV E velocity: 54.40 cm/s MV A velocity: 81.80 cm/s  SHUNTS MV E/A ratio:  0.67        Systemic VTI:  0.18 m                            Systemic Diam: 2.00  cm Donato Schultz MD Electronically signed by Donato Schultz MD Signature Date/Time: 06/21/2020/3:42:11 PM    Final         Scheduled Meds: . aspirin EC  81 mg Oral Daily  . atorvastatin  10 mg Oral QHS  . calcium-vitamin D  1 tablet Oral Daily  . dorzolamide-timolol  1 drop Both Eyes BID  . enoxaparin (LOVENOX) injection  40 mg Subcutaneous Q24H  . feeding supplement (ENSURE ENLIVE)  237 mL Oral BID BM  . fluticasone  1 spray Each Nare QHS  . gabapentin  300 mg Oral BID  . latanoprost  1 drop Left Eye Daily  . levothyroxine  50 mcg Oral Daily  . pantoprazole  40 mg Oral Daily  . sodium chloride  1 g Oral TID WC  . sucralfate  1 g Oral TID WC & HS   Continuous Infusions:   LOS: 6 days    Time spent: 35 min    Burke Keels, MD [facility]  If 7PM-7AM, please contact night-coverage  06/22/2020, 2:51 PM

## 2020-06-23 ENCOUNTER — Inpatient Hospital Stay (HOSPITAL_COMMUNITY): Payer: Medicare Other

## 2020-06-23 LAB — BASIC METABOLIC PANEL
Anion gap: 9 (ref 5–15)
BUN: 30 mg/dL — ABNORMAL HIGH (ref 8–23)
CO2: 26 mmol/L (ref 22–32)
Calcium: 9 mg/dL (ref 8.9–10.3)
Chloride: 94 mmol/L — ABNORMAL LOW (ref 98–111)
Creatinine, Ser: 0.6 mg/dL (ref 0.44–1.00)
GFR calc Af Amer: >60 mL/min (ref >60)
GFR calc non Af Amer: >60 mL/min (ref >60)
Glucose, Bld: 142 mg/dL — ABNORMAL HIGH (ref 70–99)
Potassium: 3.9 mmol/L (ref 3.5–5.1)
Sodium: 129 mmol/L — ABNORMAL LOW (ref 135–145)

## 2020-06-23 MED ORDER — SUCRALFATE 1 G PO TABS: 1.0000 g | ORAL_TABLET | Freq: Three times a day (TID) | ORAL | 0 refills | Status: AC

## 2020-06-23 MED ORDER — SODIUM CHLORIDE 1 G PO TABS: 1.0000 g | ORAL_TABLET | Freq: Three times a day (TID) | ORAL | 0 refills | Status: AC

## 2020-06-23 NOTE — Discharge Summary (Signed)
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 Pill -- Cervical Esophageal Comment Barium tablet appeared to lodge at mid-esophagus, ? near aortic arch with pt referrant sensation to pharynx, Further liquid boluses transited it to GE where it appeared to halt, Clearance into stomach appeared present after 2nd bolus of thin. Radiologist not present to confirm findings. Rolena Infanteammy K, MS [facility] SLP Acute Rehab Services Office [phone] Chales AbrahamsKimball, Tamara Ann 06/21/2020, 3:15 PM              ECHOCARDIOGRAM COMPLETE  Result Date: 06/21/2020    ECHOCARDIOGRAM REPORT   Patient Name:   Wilmon PaliMARLENE Minckler Date of Exam: 06/21/2020 Medical Rec #:  [id number]       Height:       66.0 in Accession #:    [id number]      Weight:       99.9 lb Date of Birth:  [birth year]       BSA:          1.489 m Patient Age:    79 years        BP:           144/94 mmHg Patient Gender: F               HR:           96 bpm. Exam Location:  Inpatient Procedure: 2D Echo Indications:    Dyspnea R06.00  History:        Patient has no prior history of  Echocardiogram examinations.  Sonographer:    Thurman Coyerasey Kirkpatrick RDCS (AE) Referring Phys: [id number] Alessandra BevelsNEELIMA KAMINENI IMPRESSIONS  1. Left ventricular ejection fraction, by estimation, is 60 to 65%. The left ventricle has normal function. The left ventricle has no regional wall motion abnormalities. Left ventricular diastolic parameters are consistent with Grade I diastolic dysfunction (impaired  relaxation).  2. Right ventricular systolic function is normal. The right ventricular size is normal. There is normal pulmonary artery systolic pressure.  3. The mitral valve is normal in structure. No evidence of mitral valve regurgitation. No evidence of mitral stenosis.  4. The aortic valve is normal in structure. Aortic valve regurgitation is not visualized. No aortic stenosis is present.  5. The inferior vena cava is normal in size with greater than 50% respiratory variability, suggesting right atrial pressure of 3 mmHg. FINDINGS  Left Ventricle: Left ventricular ejection fraction, by estimation, is 60 to 65%. The left ventricle has normal function. The left ventricle has no regional wall motion abnormalities. The left ventricular internal cavity size was normal in size. There is  no left ventricular hypertrophy. Left ventricular diastolic parameters are consistent with Grade I diastolic dysfunction (impaired relaxation). Right Ventricle: The right ventricular size is normal. No increase in right ventricular wall thickness. Right ventricular systolic function is normal. There is normal pulmonary artery systolic pressure. The tricuspid regurgitant velocity is 2.13 m/s, and  with an assumed right atrial pressure of 3 mmHg, the estimated right ventricular systolic pressure is 21.1 mmHg. Left Atrium: Left atrial size was normal in size. Right Atrium: Right atrial size was normal in size. Pericardium: There is no evidence of pericardial effusion. Mitral Valve: The mitral valve is normal in structure. Normal mobility of the  mitral valve leaflets. No evidence of mitral valve regurgitation. No evidence of mitral valve stenosis. Tricuspid Valve: The tricuspid valve is normal in structure. Tricuspid valve regurgitation is trivial. No evidence of tricuspid stenosis. Aortic Valve: The aortic valve is normal in structure. Aortic valve regurgitation is not visualized. No aortic stenosis is present. Pulmonic Valve: The pulmonic valve was normal in structure. Pulmonic valve regurgitation is not visualized. No evidence of pulmonic stenosis. Aorta: The aortic root is normal in size and structure. Venous: The inferior vena cava is normal in size with greater than 50% respiratory variability, suggesting right atrial pressure of 3 mmHg. IAS/Shunts: No atrial level shunt detected by color flow Doppler.  LEFT VENTRICLE PLAX 2D LVIDd:         2.40 cm  Diastology LVIDs:         2.10 cm  LV e' lateral:   7.40 cm/s LV PW:         0.86 cm  LV E/e' lateral: 7.4 LV IVS:        0.85 cm  LV e' medial:    6.31 cm/s LVOT diam:     2.00 cm  LV E/e' medial:  8.6 LV SV:         58 LV SV Index:   39 LVOT Area:     3.14 cm  RIGHT VENTRICLE RV S prime:     12.10 cm/s TAPSE (M-mode): 1.8 cm LEFT ATRIUM             Index       RIGHT ATRIUM          Index LA diam:        2.20 cm 1.48 cm/m  RA Area:     9.60 cm LA Vol (A2C):   20.5 ml 13.77 ml/m RA Volume:   17.60 ml 11.82 ml/m LA Vol (A4C):   28.2 ml 18.94 ml/m LA Biplane Vol: 23.7 ml 15.92 ml/m  AORTIC VALVE LVOT Vmax:   104.00 cm/s LVOT Vmean:  72.900 cm/s LVOT VTI:    0.184 m MITRAL VALVE  TRICUSPID VALVE MV Area (PHT): 1.96 cm    TR Peak grad:   18.1 mmHg MV Decel Time: 388 msec    TR Vmax:        213.00 cm/s MV E velocity: 54.40 cm/s MV A velocity: 81.80 cm/s  SHUNTS MV E/A ratio:  0.67        Systemic VTI:  0.18 m                            Systemic Diam: 2.00 cm Donato Schultz MD Electronically signed by Donato Schultz MD Signature Date/Time: 06/21/2020/3:42:11 PM    Final        Subjective: Pt  stable, reports feeling weak globally Had extensive discussion with son who appears to be very anxious, as well as pt as the appropriateness of SNF  Discharge Exam: Vitals:   06/22/20 2128 06/23/20 0629  BP: 109/62 (!) 103/55  Pulse: 70 84  Resp: 16 15  Temp: 98.1 F (36.7 C) (!) 97.5 F (36.4 C)  SpO2: 99% 99%   Vitals:   06/22/20 1340 06/22/20 1726 06/22/20 2128 06/23/20 0629  BP: 117/77  109/62 (!) 103/55  Pulse: 87 91 70 84  Resp: 15  16 15   Temp: 98.2 F (36.8 C)  98.1 F (36.7 C) (!) 97.5 F (36.4 C)  TempSrc: Oral  Oral Oral  SpO2: 98% 98% 99% 99%  Weight:      Height:        General: Pt is alert, awake, anxious at baseline, thin Cardiovascular: RRR, S1/S2 +, no rubs, no gallops Respiratory: CTA bilaterally, no wheezing, no rhonchi Abdominal: Soft, NT, ND, bowel sounds + Extremities: no edema, no cyanosis    The results of significant diagnostics from this hospitalization (including imaging, microbiology, ancillary and laboratory) are listed below for reference.     Microbiology: Recent Results (from the past 240 hour(s))  SARS Coronavirus 2 by RT PCR (hospital order, performed in [facility] hospital lab) Nasopharyngeal Nasopharyngeal Swab     Status: None   Collection Time: 06/15/20  9:49 PM   Specimen: Nasopharyngeal Swab  Result Value Ref Range Status   SARS Coronavirus 2 NEGATIVE NEGATIVE Final    Comment: (NOTE) SARS-CoV-2 target nucleic acids are NOT DETECTED.  The SARS-CoV-2 RNA is generally detectable in upper and lower respiratory specimens during the acute phase of infection. The lowest concentration of SARS-CoV-2 viral copies this assay can detect is 250 copies / mL. A negative result does not preclude SARS-CoV-2 infection and should not be used as the sole basis for treatment or other patient management decisions.  A negative result may occur with improper specimen collection / handling, submission of specimen other than nasopharyngeal  swab, presence of viral mutation(s) within the areas targeted by this assay, and inadequate number of viral copies (<250 copies / mL). A negative result must be combined with clinical observations, patient history, and epidemiological information.  Fact Sheet for Patients:   08/16/20  Fact Sheet for Healthcare Providers: BoilerBrush.com.cy  This test is not yet approved or  cleared by the https://pope.com/ FDA and has been authorized for detection and/or diagnosis of SARS-CoV-2 by FDA under an Emergency Use Authorization (EUA).  This EUA will remain in effect (meaning this test can be used) for the duration of the COVID-19 declaration under Section 564(b)(1) of the Act, 21 U.S.C. section 360bbb-3(b)(1), unless the authorization is terminated or revoked sooner.  Performed at [facility], [address], [place], [place] [address]   SARS Coronavirus 2 by RT PCR (hospital order, performed in [facility] hospital lab) Nasopharyngeal Nasopharyngeal Swab     Status: None   Collection Time: 06/21/20 11:08 PM   Specimen: Nasopharyngeal Swab  Result Value Ref Range Status   SARS Coronavirus 2 NEGATIVE NEGATIVE Final    Comment: (NOTE) SARS-CoV-2 target nucleic acids are NOT DETECTED.  The SARS-CoV-2 RNA is generally detectable in upper and lower respiratory specimens during the acute phase of infection. The lowest concentration of SARS-CoV-2 viral copies this assay can detect is 250 copies / mL. A negative result does not preclude SARS-CoV-2 infection and should not be used as the sole basis for treatment or other patient management decisions.  A negative result may occur with improper specimen collection / handling, submission of specimen other than nasopharyngeal swab, presence of viral mutation(s) within the areas targeted by this assay, and inadequate number of viral copies (<250 copies / mL). A negative result  must be combined with clinical observations, patient history, and epidemiological information.  Fact Sheet for Patients:   BoilerBrush.com.cy  Fact Sheet for Healthcare Providers: https://pope.com/  This test is not yet approved or  cleared by the [place] FDA and has been authorized for detection and/or diagnosis of SARS-CoV-2 by FDA under an Emergency Use Authorization (EUA).  This EUA will remain in effect (meaning this test can be used) for the duration of the COVID-19 declaration under Section 564(b)(1) of the Act, 21 U.S.C. section 360bbb-3(b)(1), unless the authorization is terminated or revoked sooner.  Performed at [facility], [address] [place], [place] [address]      Labs: BNP (last 3 results) Recent Labs    06/21/20 1403  BNP 67.1   Basic Metabolic Panel: Recent Labs  Lab 06/20/20 0549 06/21/20 0545 06/22/20 0623 06/22/20 1535 06/23/20 0923  NA 131* 129* 126* 126* 129*  K 4.3 4.4 5.2* 4.7 3.9  CL 94* 94* 89* 92* 94*  CO2 28 25 27 24 26   GLUCOSE 109* 104* 158* 154* 142*  BUN 27* 20 20 26* 30*  CREATININE 0.65 0.60 0.72 0.73 0.60  CALCIUM 8.9 9.2 9.5 8.9 9.0   Liver Function Tests: No results for input(s): AST, ALT, ALKPHOS, BILITOT, PROT, ALBUMIN in the last 168 hours. No results for input(s): LIPASE, AMYLASE in the last 168 hours. No results for input(s): AMMONIA in the last 168 hours. CBC: No results for input(s): WBC, NEUTROABS, HGB, HCT, MCV, PLT in the last 168 hours. Cardiac Enzymes: No results for input(s): CKTOTAL, CKMB, CKMBINDEX, TROPONINI in the last 168 hours. BNP: Invalid input(s): POCBNP CBG: No results for input(s): GLUCAP in the last 168 hours. D-Dimer No results for input(s): DDIMER in the last 72 hours. Hgb A1c No results for input(s): HGBA1C in the last 72 hours. Lipid Profile No results for input(s): CHOL, HDL, LDLCALC, TRIG, CHOLHDL, LDLDIRECT in  the last 72 hours. Thyroid function studies No results for input(s): TSH, T4TOTAL, T3FREE, THYROIDAB in the last 72 hours.  Invalid input(s): FREET3 Anemia work up No results for input(s): VITAMINB12, FOLATE, FERRITIN, TIBC, IRON, RETICCTPCT in the last 72 hours. Urinalysis    Component Value Date/Time   COLORURINE YELLOW 06/15/2020 1831   APPEARANCEUR CLEAR 06/15/2020 1831   LABSPEC 1.010 06/15/2020 1831   PHURINE 7.0 06/15/2020 1831   GLUCOSEU 150 (A) 06/15/2020 1831   HGBUR NEGATIVE 06/15/2020 1831   BILIRUBINUR NEGATIVE 06/15/2020 1831   KETONESUR NEGATIVE  06/15/2020 1831   PROTEINUR NEGATIVE 06/15/2020 1831   NITRITE NEGATIVE 06/15/2020 1831   LEUKOCYTESUR NEGATIVE 06/15/2020 1831   Sepsis Labs Invalid input(s): PROCALCITONIN,  WBC,  LACTICIDVEN Microbiology Recent Results (from the past 240 hour(s))  SARS Coronavirus 2 by RT PCR (hospital order, performed in [facility] hospital lab) Nasopharyngeal Nasopharyngeal Swab     Status: None   Collection Time: 06/15/20  9:49 PM   Specimen: Nasopharyngeal Swab  Result Value Ref Range Status   SARS Coronavirus 2 NEGATIVE NEGATIVE Final    Comment: (NOTE) SARS-CoV-2 target nucleic acids are NOT DETECTED.  The SARS-CoV-2 RNA is generally detectable in upper and lower respiratory specimens during the acute phase of infection. The lowest concentration of SARS-CoV-2 viral copies this assay can detect is 250 copies / mL. A negative result does not preclude SARS-CoV-2 infection and should not be used as the sole basis for treatment or other patient management decisions.  A negative result may occur with improper specimen collection / handling, submission of specimen other than nasopharyngeal swab, presence of viral mutation(s) within the areas targeted by this assay, and inadequate number of viral copies (<250 copies / mL). A negative result must be combined with clinical observations, patient history, and epidemiological  information.  Fact Sheet for Patients:   BoilerBrush.com.cy  Fact Sheet for Healthcare Providers: https://pope.com/  This test is not yet approved or  cleared by the [place] FDA and has been authorized for detection and/or diagnosis of SARS-CoV-2 by FDA under an Emergency Use Authorization (EUA).  This EUA will remain in effect (meaning this test can be used) for the duration of the COVID-19 declaration under Section 564(b)(1) of the Act, 21 U.S.C. section 360bbb-3(b)(1), unless the authorization is terminated or revoked sooner.  Performed at [facility], [address], [place], [place] [address]   SARS Coronavirus 2 by RT PCR (hospital order, performed in [facility] hospital lab) Nasopharyngeal Nasopharyngeal Swab     Status: None   Collection Time: 06/21/20 11:08 PM   Specimen: Nasopharyngeal Swab  Result Value Ref Range Status   SARS Coronavirus 2 NEGATIVE NEGATIVE Final    Comment: (NOTE) SARS-CoV-2 target nucleic acids are NOT DETECTED.  The SARS-CoV-2 RNA is generally detectable in upper and lower respiratory specimens during the acute phase of infection. The lowest concentration of SARS-CoV-2 viral copies this assay can detect is 250 copies / mL. A negative result does not preclude SARS-CoV-2 infection and should not be used as the sole basis for treatment or other patient management decisions.  A negative result may occur with improper specimen collection / handling, submission of specimen other than nasopharyngeal swab, presence of viral mutation(s) within the areas targeted by this assay, and inadequate number of viral copies (<250 copies / mL). A negative result must be combined with clinical observations, patient history, and epidemiological information.  Fact Sheet for Patients:   BoilerBrush.com.cy  Fact Sheet for Healthcare  Providers: https://pope.com/  This test is not yet approved or  cleared by the [place] FDA and has been authorized for detection and/or diagnosis of SARS-CoV-2 by FDA under an Emergency Use Authorization (EUA).  This EUA will remain in effect (meaning this test can be used) for the duration of the COVID-19 declaration under Section 564(b)(1) of the Act, 21 U.S.C. section 360bbb-3(b)(1), unless the authorization is terminated or revoked sooner.  Performed at [facility], [address], [place], [place] [address]      Time coordinating  discharge: Over 30 minutes  SIGNED:   Burke Keels, MD  [facility] 06/23/2020, 11:05 AM Pager   If 7PM-7AM, please contact night-coverage [email or web address] Password [email or web address]

## 2020-06-23 NOTE — TOC Transition Note (Signed)
Transition of Care [facility]) - CM/SW Discharge Note   Patient Details  Name: Brenda Simpson MRN: [id number] Date of Birth: October 21, 1941  [facility] CM/SW Contact:  Joseph Art, LCSWA Phone Number: 06/23/2020, 3:53 PM   Clinical Narrative:     Received voice mail from Coyle at [facility] [phone], script for Klonopin PRN was not included in d/c summary.  CSW sent Amion message to Attending and IM chat.      Barriers to Discharge: Continued Medical Work up   Patient Goals and CMS Choice Patient states their goals for this hospitalization and ongoing recovery are:: to get better CMS Medicare.gov Compare Post Acute Care list provided to:: Patient Choice offered to / list presented to : Patient  Discharge Placement                       Discharge Plan and Services   Discharge Planning Services: CM Consult Post Acute Care Choice: Home Health          DME Arranged: 3-N-1, Walker rolling DME Agency: [employer] Date DME Agency Contacted: 06/18/20 Time DME Agency Contacted: 77 Representative spoke with at DME Agency: Ian Malkin HH Arranged: PT, OT HH Agency: [facility] Date [facility] Agency Contacted: 06/18/20 Time HH Agency Contacted: 1300 Representative spoke with at [facility] Agency: Kandee Keen  Social Determinants of Health (SDOH) Interventions     Readmission Risk Interventions No flowsheet data found.

## 2020-06-23 NOTE — Progress Notes (Addendum)
Tried calling [place] to give report no one answering. Patient discharged via [name]. Patient kept stating she couldn't breath without the O2. Her O2 sat was 100% on room air. A walker and bedside commode was sent home with the son. A prescription was [facility] with a [facility]. Brenda Simpson is anxious about going to [facility].Son is going to meet her there.

## 2020-06-23 NOTE — Discharge Instructions (Signed)
Hyponatremia Hyponatremia is when the amount of salt (sodium) in your blood is too low. When salt levels are low, your body may take in extra water. This can cause swelling throughout the body. The swelling often affects the brain. What are the causes? This condition may be caused by:  Certain medical problems or conditions.  Vomiting a lot.  Having watery poop (diarrhea) often.  Certain medicines or illegal drugs.  Not having enough water in the body (dehydration).  Drinking too much water.  Eating a diet that is low in salt.  Large burns on your body.  Too much sweating. What increases the risk? You are more likely to get this condition if you:  Have long-term (chronic) kidney disease.  Have heart failure.  Have a medical condition that causes you to have watery poop often.  Do very hard exercises.  Take medicines that affect the amount of salt is in your blood. What are the signs or symptoms? Symptoms of this condition include:  Headache.  Feeling like you may vomit (nausea).  Vomiting.  Being very tired (lethargic).  Muscle weakness and cramps.  Not wanting to eat as much as normal (loss of appetite).  Feeling weak or light-headed. Severe symptoms of this condition include:  Confusion.  Feeling restless (agitation).  Having a fast heart rate.  Passing out (fainting).  Seizures.  Coma. How is this treated? Treatment for this condition depends on the cause. Treatment may include:  Getting fluids through an IV tube that is put into one of your veins.  Taking medicines to fix the salt levels in your blood. If medicines are causing the problem, your medicines will need to be changed.  Limiting how much water or fluid you take in.  Monitoring in the hospital to watch your symptoms. Follow these instructions at home:   Take over-the-counter and prescription medicines only as told by your doctor. Many medicines can make this condition worse.  Talk with your doctor about any medicines that you are taking.  Eat and drink exactly as you are told by your doctor. ? Eat only the foods you are told to eat. ? Limit how much fluid you take.  Do not drink alcohol.  Keep all follow-up visits as told by your doctor. This is important. Contact a doctor if:  You feel more like you may vomit.  You feel more tired.  Your headache gets worse.  You feel more confused.  You feel weaker.  Your symptoms go away and then they come back.  You have trouble following the diet instructions. Get help right away if:  You have a seizure.  You pass out.  You keep having watery poop.  You keep vomiting. Summary  Hyponatremia is when the amount of salt in your blood is too low.  When salt levels are low, you can have swelling throughout the body. The swelling mostly affects the brain.  Treatment depends on the cause. Treatment may include getting IV fluids, medicines, or not drinking as much fluid. This information is not intended to replace advice given to you by your health care provider. Make sure you discuss any questions you have with your health care provider. Document Revised: 02/09/2019 Document Reviewed: 10/27/2018 Elsevier Patient Education  2020 Elsevier Inc.   Nausea, Adult Nausea is feeling sick to your stomach or feeling that you are about to throw up (vomit). Feeling sick to your stomach is usually not serious, but it may be an early sign of a more  serious medical problem. As you feel sicker to your stomach, you may throw up. If you throw up, or if you are not able to drink enough fluids, there is a risk that you may lose too much water in your body (get dehydrated). If you lose too much water in your body, you may:  Feel tired.  Feel thirsty.  Have a dry mouth.  Have cracked lips.  Go pee (urinate) less often. Older adults and people who have other diseases or a weak body defense system (immune system) have a  higher risk of losing too much water in the body. The main goals of treating this condition are:  To relieve your nausea.  To ensure your nausea occurs less often.  To prevent throwing up and losing too much fluid. Follow these instructions at home: Watch your symptoms for any changes. Tell your doctor about them. Follow these instructions as told by your doctor. Eating and drinking      Take an ORS (oral rehydration solution). This is a drink that is sold at pharmacies and stores.  Drink clear fluids in small amounts as you are able. These include: ? Water. ? Ice chips. ? Fruit juice that has water added (diluted fruit juice). ? Low-calorie sports drinks.  Eat bland, easy-to-digest foods in small amounts as you are able, such as: ? Bananas. ? Applesauce. ? Rice. ? Low-fat (lean) meats. ? Toast. ? Crackers.  Avoid drinking fluids that have a lot of sugar or caffeine in them. This includes energy drinks, sports drinks, and soda.  Avoid alcohol.  Avoid spicy or fatty foods. General instructions  Take over-the-counter and prescription medicines only as told by your doctor.  Rest at home while you get better.  Drink enough fluid to keep your pee (urine) pale yellow.  Take slow and deep breaths when you feel sick to your stomach.  Avoid food or things that have strong smells.  Wash your hands often with soap and water. If you cannot use soap and water, use hand sanitizer.  Make sure that all people in your home wash their hands well and often.  Keep all follow-up visits as told by your doctor. This is important. Contact a doctor if:  You feel sicker to your stomach.  You feel sick to your stomach for more than 2 days.  You throw up.  You are not able to drink fluids without throwing up.  You have new symptoms.  You have a fever.  You have a headache.  You have muscle cramps.  You have a rash.  You have pain while peeing.  You feel light-headed or  dizzy. Get help right away if:  You have pain in your chest, neck, arm, or jaw.  You feel very weak or you pass out (faint).  You have throw up that is bright red or looks like coffee grounds.  You have bloody or black poop (stools) or poop that looks like tar.  You have a very bad headache, a stiff neck, or both.  You have very bad pain, cramping, or bloating in your belly (abdomen).  You have trouble breathing or you are breathing very quickly.  Your heart is beating very quickly.  Your skin feels cold and clammy.  You feel confused.  You have signs of losing too much water in your body, such as: ? Dark pee, very little pee, or no pee. ? Cracked lips. ? Dry mouth. ? Sunken eyes. ? Sleepiness. ? Weakness. These symptoms  may be an emergency. Do not wait to see if the symptoms will go away. Get medical help right away. Call your local emergency services (911 in the [place] Do not drive yourself to the hospital. Summary  Nausea is feeling sick to your stomach or feeling that you are about to throw up (vomit).  If you throw up, or if you are not able to drink enough fluids, there is a risk that you may lose too much water in your body (get dehydrated).  Eat and drink what your doctor tells you. Take over-the-counter and prescription medicines only as told by your doctor.  Contact a doctor right away if your symptoms get worse or you have new symptoms.  Keep all follow-up visits as told by your doctor. This is important. This information is not intended to replace advice given to you by your health care provider. Make sure you discuss any questions you have with your health care provider. Document Revised: 05/03/2018 Document Reviewed: 05/03/2018 Elsevier Patient Education  2020 Else[employer] Inc.   Nausea and Vomiting, Adult Nausea is feeling sick to your stomach or feeling that you are about to throw up (vomit). Vomiting is when food in your stomach is thrown up and out of  the mouth. Throwing up can make you feel weak. It can also make you lose too much water in your body (get dehydrated). If you lose too much water in your body, you may:  Feel tired.  Feel thirsty.  Have a dry mouth.  Have cracked lips.  Go pee (urinate) less often. Older adults and people with other diseases or a weak body defense system (immune system) are at higher risk for losing too much water in the body. If you feel sick to your stomach and you throw up, it is important to follow instructions from your doctor about how to take care of yourself. Follow these instructions at home: Watch your symptoms for any changes. Tell your doctor about them. Follow these instructions to care for yourself at home. Eating and drinking      Take an ORS (oral rehydration solution). This is a drink that is sold at pharmacies and stores.  Drink clear fluids in small amounts as you are able, such as: ? Water. ? Ice chips. ? Fruit juice that has water added (diluted fruit juice). ? Low-calorie sports drinks.  Eat bland, easy-to-digest foods in small amounts as you are able, such as: ? Bananas. ? Applesauce. ? Rice. ? Low-fat (lean) meats. ? Toast. ? Crackers.  Avoid drinking fluids that have a lot of sugar or caffeine in them. This includes energy drinks, sports drinks, and soda.  Avoid alcohol.  Avoid spicy or fatty foods. General instructions  Take over-the-counter and prescription medicines only as told by your doctor.  Drink enough fluid to keep your pee (urine) pale yellow.  Wash your hands often with soap and water. If you cannot use soap and water, use hand sanitizer.  Make sure that all people in your home wash their hands well and often.  Rest at home while you get better.  Watch your condition for any changes.  Take slow and deep breaths when you feel sick to your stomach.  Keep all follow-up visits as told by your doctor. This is important. Contact a doctor  if:  Your symptoms get worse.  You have new symptoms.  You have a fever.  You cannot drink fluids without throwing up.  You feel sick to your stomach for  more than 2 days.  You feel light-headed or dizzy.  You have a headache.  You have muscle cramps.  You have a rash.  You have pain while peeing. Get help right away if:  You have pain in your chest, neck, arm, or jaw.  You feel very weak or you pass out (faint).  You throw up again and again.  You have throw up that is bright red or looks like black coffee grounds.  You have bloody or black poop (stools) or poop that looks like tar.  You have a very bad headache, a stiff neck, or both.  You have very bad pain, cramping, or bloating in your belly (abdomen).  You have trouble breathing.  You are breathing very quickly.  Your heart is beating very quickly.  Your skin feels cold and clammy.  You feel confused.  You have signs of losing too much water in your body, such as: ? Dark pee, very little pee, or no pee. ? Cracked lips. ? Dry mouth. ? Sunken eyes. ? Sleepiness. ? Weakness. These symptoms may be an emergency. Do not wait to see if the symptoms will go away. Get medical help right away. Call your local emergency services (911 in the [place] Do not drive yourself to the hospital. Summary  Nausea is feeling sick to your stomach or feeling that you are about to throw up (vomit). Vomiting is when food in your stomach is thrown up and out of the mouth.  Follow instructions from your doctor about eating and drinking to keep from losing too much water in your body.  Take over-the-counter and prescription medicines only as told by your doctor.  Contact your doctor if your symptoms get worse or you have new symptoms.  Keep all follow-up visits as told by your doctor. This is important. This information is not intended to replace advice given to you by your health care provider. Make sure you discuss any  questions you have with your health care provider. Document Revised: 03/17/2019 Document Reviewed: 05/03/2018 Elsevier Patient Education  2020 [employer]

## 2020-06-23 NOTE — TOC Transition Note (Addendum)
Transition of Care [facility]) - CM/SW Discharge Note   Patient Details  Name: Brenda Simpson MRN: [id number] Date of Birth: Jan 05, 1941  Transition of Care [facility]) CM/SW Contact:  Marina Goodell Phone Number: [phone] 06/23/2020, 10:53 AM   Clinical Narrative:     Patient will d/c to [facility] Room # 108, Report# [id number], EDP and ED Staff has been notified.  CSW called and scheduled [name] for transport.     Barriers to Discharge: Continued Medical Work up   Patient Goals and CMS Choice Patient states their goals for this hospitalization and ongoing recovery are:: to get better CMS Medicare.gov Compare Post Acute Care list provided to:: Patient Choice offered to / list presented to : Patient  Discharge Placement                       Discharge Plan and Services   Discharge Planning Services: CM Consult Post Acute Care Choice: Home Health          DME Arranged: 3-N-1, Walker rolling DME Agency: [employer] Date DME Agency Contacted: 06/18/20 Time DME Agency Contacted: 95 Representative spoke with at DME Agency: Ian Malkin HH Arranged: PT, OT HH Agency: [facility] Date [facility] Agency Contacted: 06/18/20 Time HH Agency Contacted: 1300 Representative spoke with at [facility] Agency: Kandee Keen  Social Determinants of Health (SDOH) Interventions     Readmission Risk Interventions No flowsheet data found.

## 2021-04-04 IMAGING — CT CT HEAD W/O CM
3 series · 15 of 47 positions shown, 18 images · non-contrast
Comparison: CT a head 12/11/2017

CLINICAL DATA: Mental status change

EXAM:
CT HEAD WITHOUT CONTRAST
TECHNIQUE: Contiguous axial images were obtained from the base of the skull
through the vertex without intravenous contrast.

[Series 2: head wo · axial · 0.43mm/px · z∈[+1640,+1765]mm · 9 of 31 slices shown, 12 images]
[im 3/31  brain]
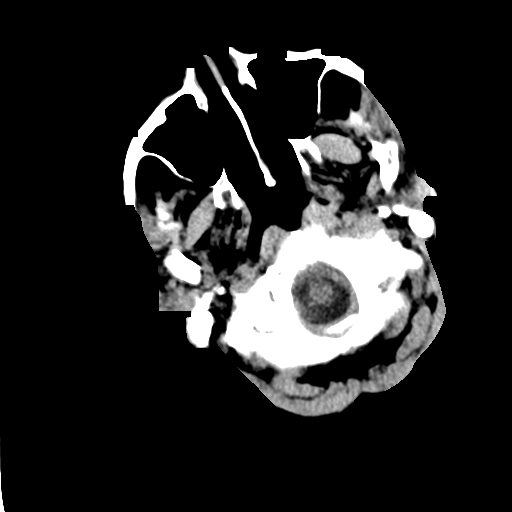
[im 3/31  bone]
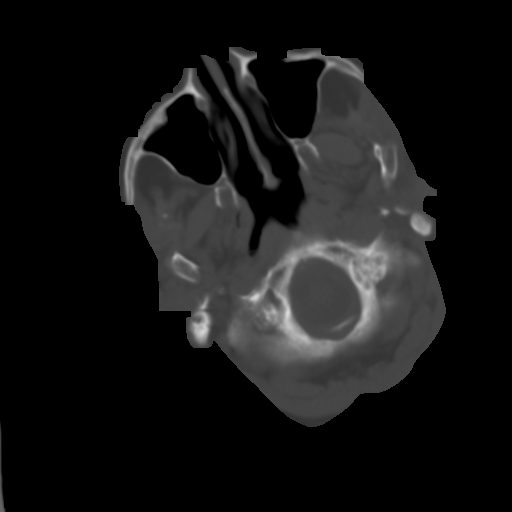
[im 6/31  brain]
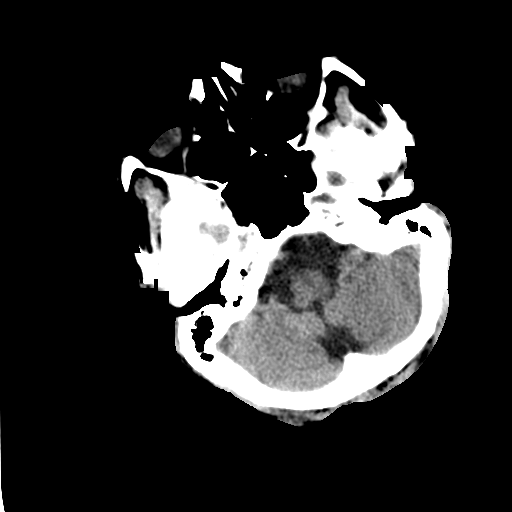
[im 9/31  brain]
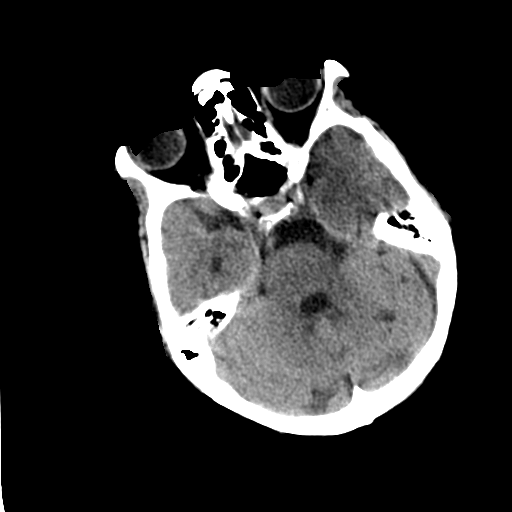
[im 12/31  brain]
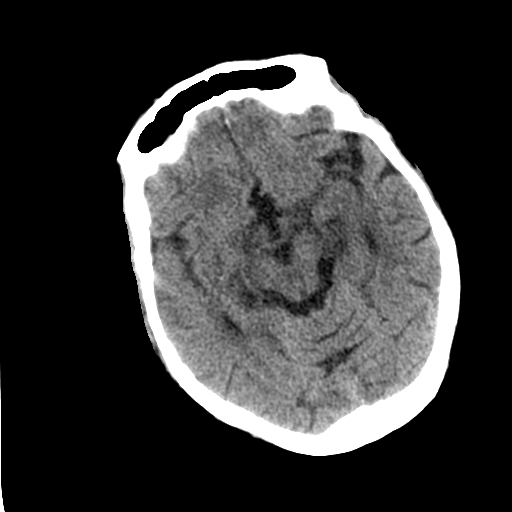
[im 16/31  brain]
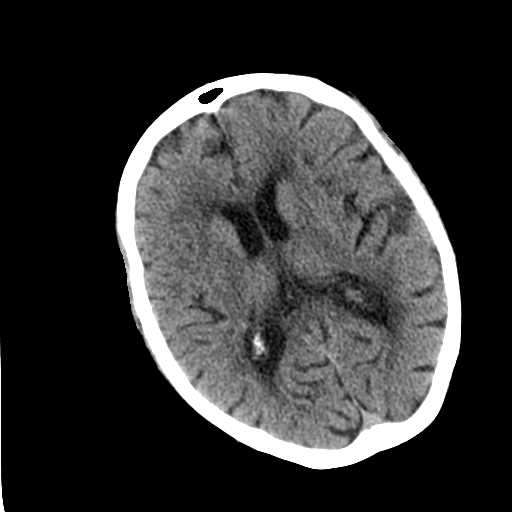
[im 16/31  bone]
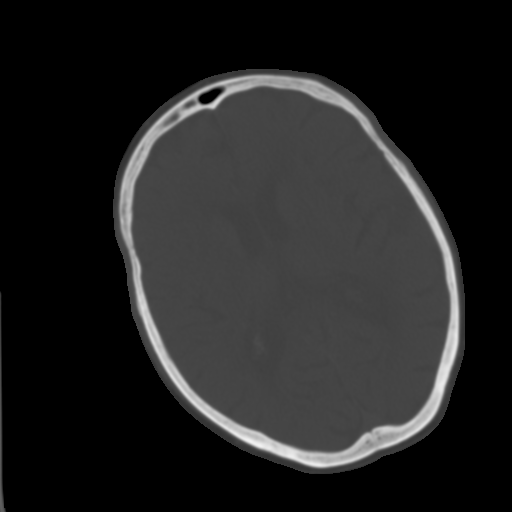
[im 19/31  brain]
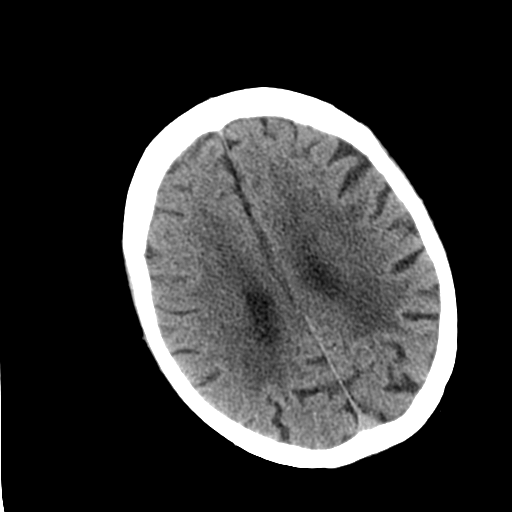
[im 22/31  brain]
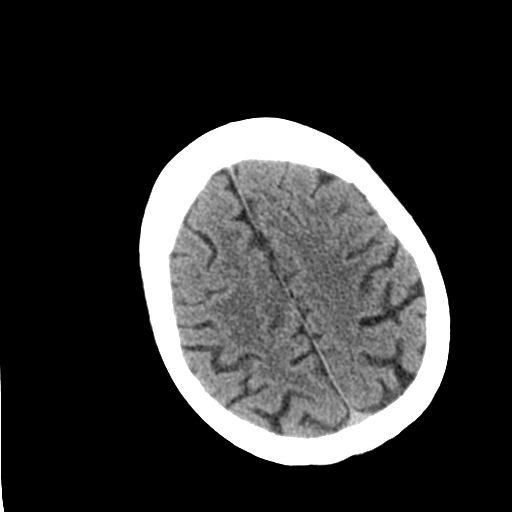
[im 25/31  brain]
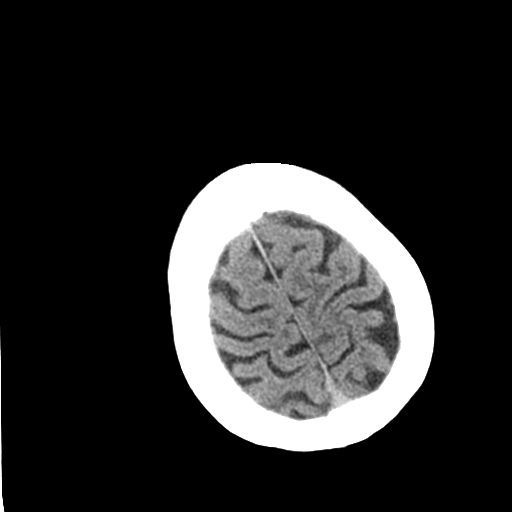
[im 28/31  brain]
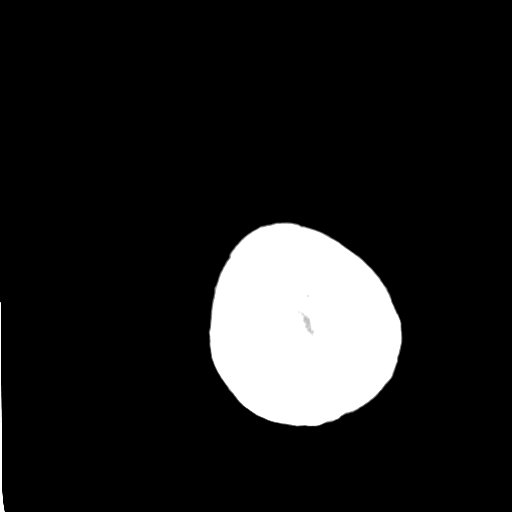
[im 28/31  bone]
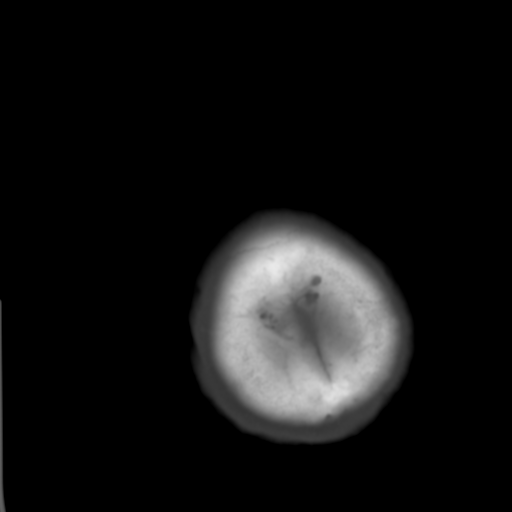

[Series 4: coronal soft tissue · coronal · 0.28mm/px · 3 of 60 slices shown]
[im 20/60  brain]
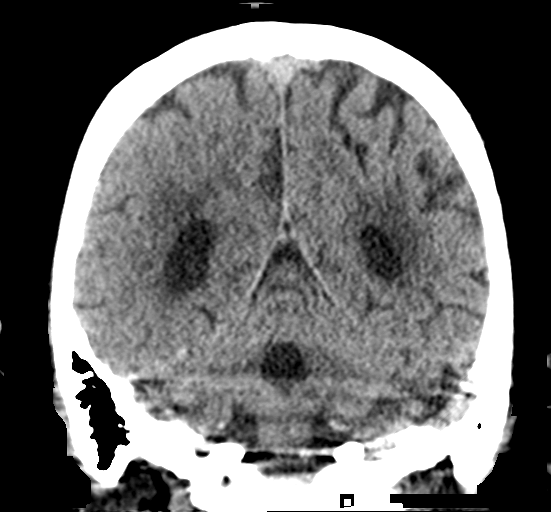
[im 27/60  brain]
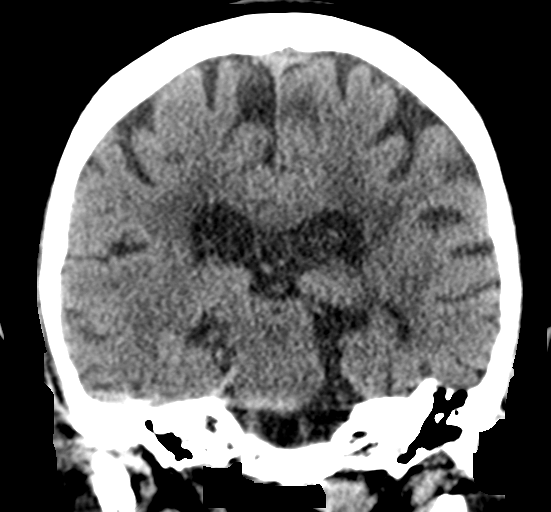
[im 33/60  brain]
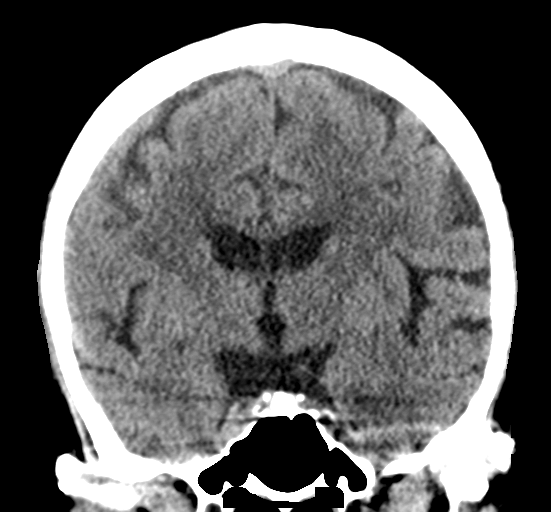

[Series 5: sagittal soft tissue · sagittal · 0.28mm/px · 3 of 51 slices shown]
[im 17/51  brain]
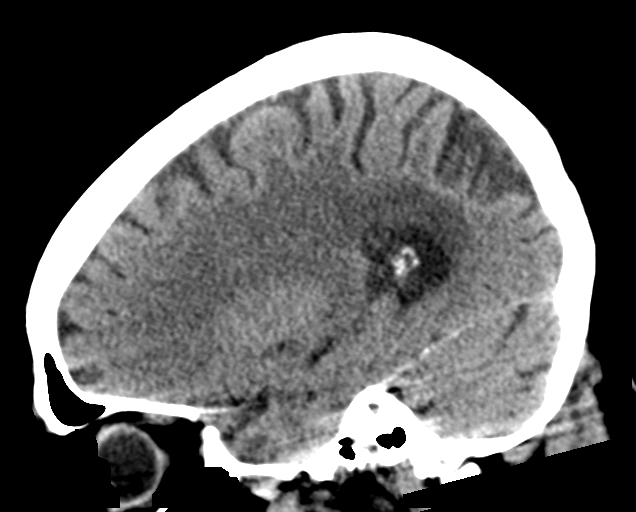
[im 26/51  brain]
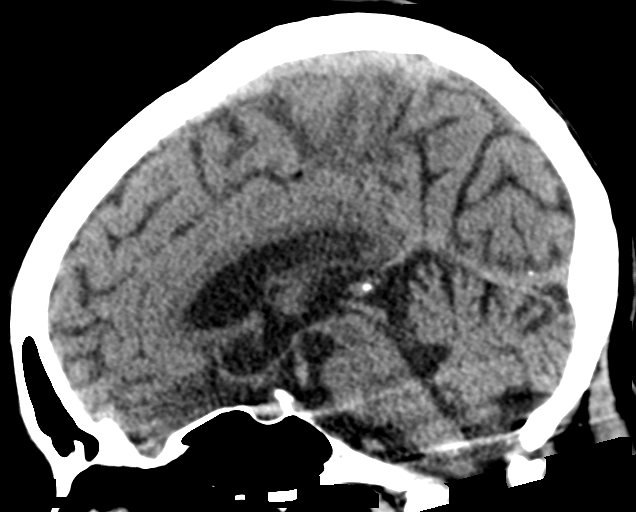
[im 34/51  brain]
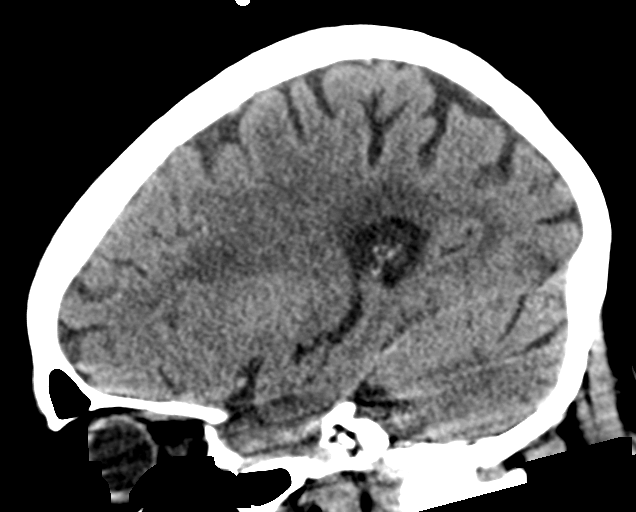

[15 of 47 positions shown; findings below may reference images not displayed]

FINDINGS: Brain: Mild atrophy without hydrocephalus. Progression of chronic
microvascular ischemic changes in the white matter diffusely. Small
chronic infarct left cerebellum unchanged.

Negative for acute infarct, hemorrhage, mass

Vascular: Negative for hyperdense vessel

Skull: Surgical wires are present along the posterior zygomatic arch
bilaterally. There are extensive erosive changes of the mandibular
condyle bilaterally. Bony irregularity of the mandibular ramus
bilaterally consistent with prior fracture.

Chronic fracture of C1 on the left anteriorly and posteriorly. This
is not seen on the prior CT cervical spine of 12/11/2017

Sinuses/Orbits: Paranasal sinuses clear. Mastoid clear. Left
cataract extraction.

Other: None
IMPRESSION: 1. No acute intracranial abnormality. Atrophy and chronic
microvascular ischemic changes.
2. Chronic fractures of the mandible bilaterally with extensive
erosive changes in the mandibular condyle bilaterally. This is
unchanged
3. Nondisplaced fracture of C1 anteriorly and posteriorly on the
left, not seen on the prior CT. These appear nonacute. Correlate
with any recent trauma history.

## 2021-04-08 IMAGING — DX DG CHEST 1V PORT
1 series · 1 of 1 positions shown · non-contrast
Comparison: June 15, 2020

CLINICAL DATA: Dyspnea

EXAM:
PORTABLE CHEST 1 VIEW

[chest ap]
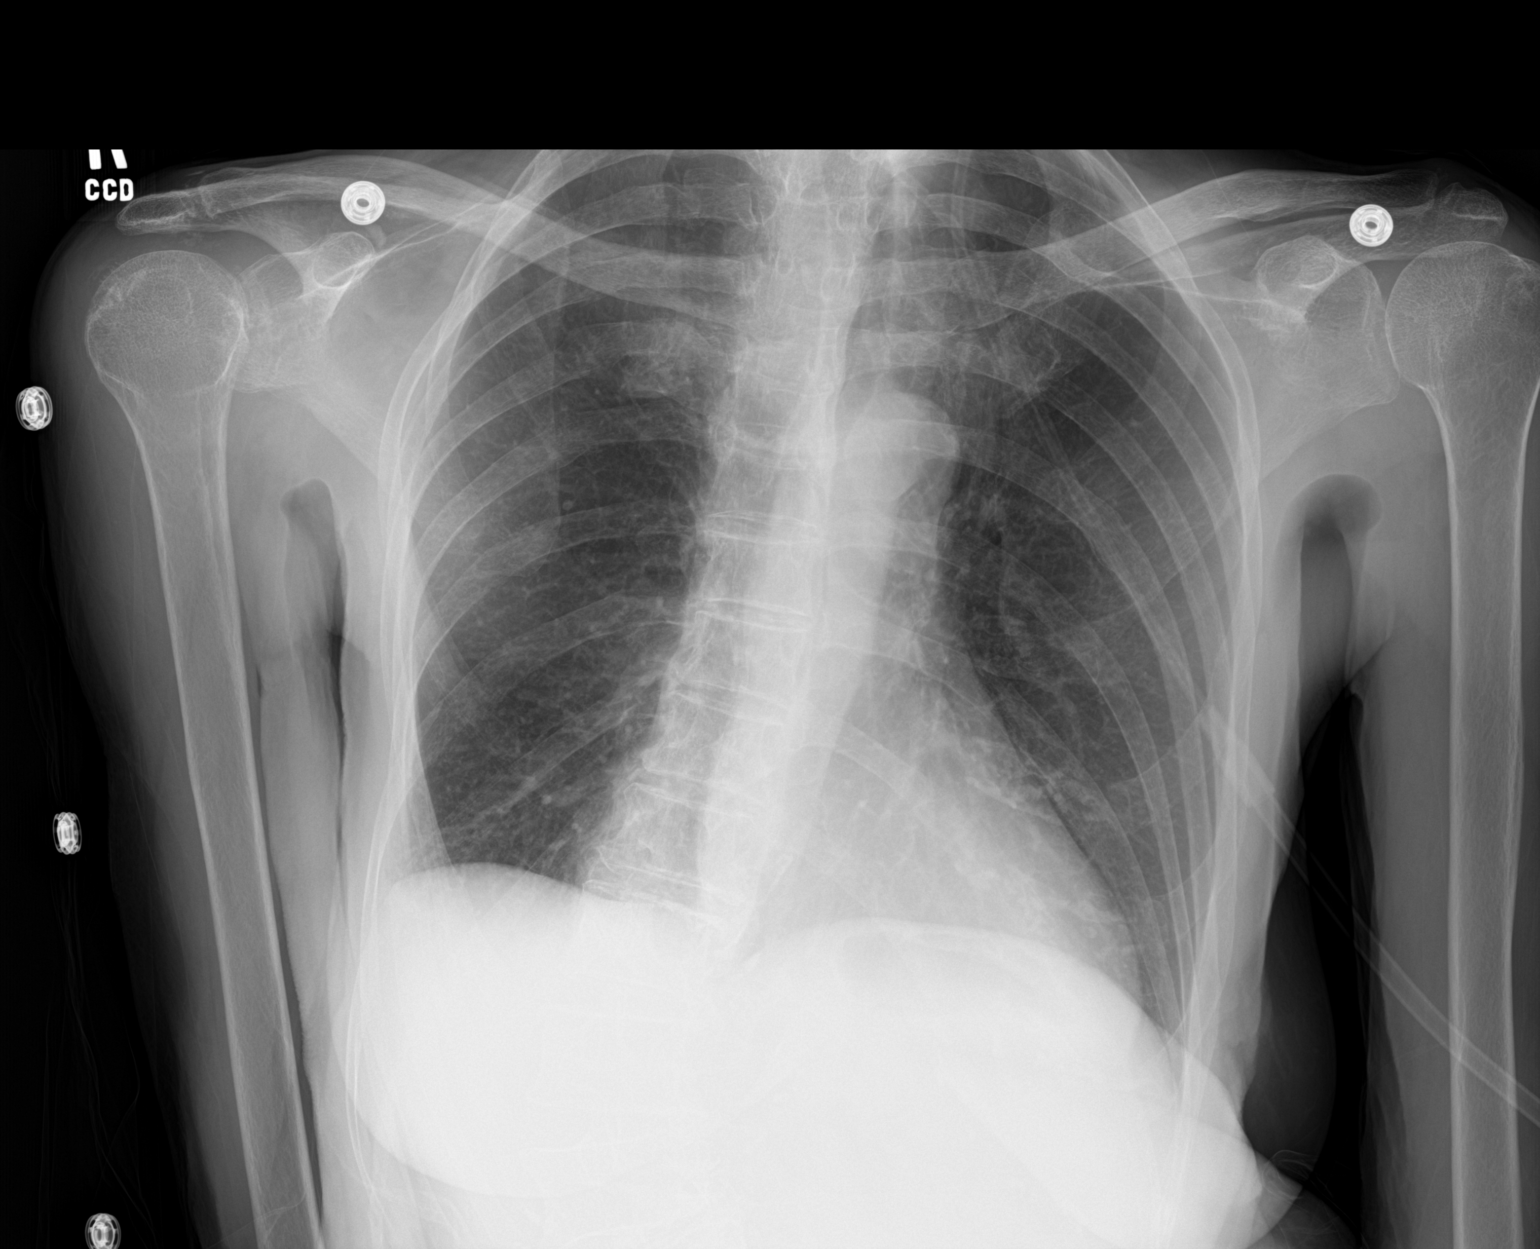

[1 of 1 positions shown; findings below may reference images not displayed]

FINDINGS: The heart size and mediastinal contours are within normal limits.
Both lungs are clear. No acute osseous abnormality.
IMPRESSION: No active disease.
